# Patient Record
Sex: Female | Born: 1940
Health system: Southern US, Community
[De-identification: ages and names within clinical notes are randomized; demographics above are authoritative.]

## PROBLEM LIST (undated history)

## (undated) DIAGNOSIS — T4145XA Adverse effect of unspecified anesthetic, initial encounter: Secondary | ICD-10-CM

## (undated) DIAGNOSIS — M1990 Unspecified osteoarthritis, unspecified site: Secondary | ICD-10-CM

## (undated) DIAGNOSIS — Z9889 Other specified postprocedural states: Secondary | ICD-10-CM

## (undated) DIAGNOSIS — Z87442 Personal history of urinary calculi: Secondary | ICD-10-CM

## (undated) DIAGNOSIS — T8859XA Other complications of anesthesia, initial encounter: Secondary | ICD-10-CM

## (undated) DIAGNOSIS — R112 Nausea with vomiting, unspecified: Secondary | ICD-10-CM

## (undated) HISTORY — PX: EYE SURGERY: SHX253

## (undated) HISTORY — PX: ABDOMINAL HYSTERECTOMY: SHX81

## (undated) HISTORY — PX: BRAIN SURGERY: SHX531

---

## 1998-10-18 ENCOUNTER — Other Ambulatory Visit: Admission: RE | Admit: 1998-10-18 | Discharge: 1998-10-18 | Payer: Self-pay | Admitting: Family Medicine

## 1999-12-11 ENCOUNTER — Other Ambulatory Visit: Admission: RE | Admit: 1999-12-11 | Discharge: 1999-12-11 | Payer: Self-pay | Admitting: Family Medicine

## 2000-12-12 ENCOUNTER — Other Ambulatory Visit: Admission: RE | Admit: 2000-12-12 | Discharge: 2000-12-12 | Payer: Self-pay | Admitting: Family Medicine

## 2001-12-15 ENCOUNTER — Other Ambulatory Visit: Admission: RE | Admit: 2001-12-15 | Discharge: 2001-12-15 | Payer: Self-pay | Admitting: Family Medicine

## 2002-12-28 ENCOUNTER — Other Ambulatory Visit: Admission: RE | Admit: 2002-12-28 | Discharge: 2002-12-28 | Payer: Self-pay | Admitting: Family Medicine

## 2003-08-12 ENCOUNTER — Ambulatory Visit (HOSPITAL_COMMUNITY): Admission: RE | Admit: 2003-08-12 | Discharge: 2003-08-12 | Payer: Self-pay | Admitting: Neurology

## 2004-01-06 ENCOUNTER — Ambulatory Visit: Payer: Self-pay | Admitting: Family Medicine

## 2004-03-23 ENCOUNTER — Ambulatory Visit: Payer: Self-pay | Admitting: Family Medicine

## 2004-07-17 ENCOUNTER — Ambulatory Visit: Payer: Self-pay | Admitting: Family Medicine

## 2015-05-24 DIAGNOSIS — H26492 Other secondary cataract, left eye: Secondary | ICD-10-CM | POA: Diagnosis not present

## 2015-06-01 DIAGNOSIS — H26492 Other secondary cataract, left eye: Secondary | ICD-10-CM | POA: Diagnosis not present

## 2015-06-21 DIAGNOSIS — E7800 Pure hypercholesterolemia, unspecified: Secondary | ICD-10-CM | POA: Diagnosis not present

## 2015-06-21 DIAGNOSIS — Z0000 Encounter for general adult medical examination without abnormal findings: Secondary | ICD-10-CM | POA: Diagnosis not present

## 2015-06-23 DIAGNOSIS — E7800 Pure hypercholesterolemia, unspecified: Secondary | ICD-10-CM | POA: Diagnosis not present

## 2015-06-23 DIAGNOSIS — Z0000 Encounter for general adult medical examination without abnormal findings: Secondary | ICD-10-CM | POA: Diagnosis not present

## 2015-06-23 DIAGNOSIS — F5101 Primary insomnia: Secondary | ICD-10-CM | POA: Insufficient documentation

## 2015-06-23 DIAGNOSIS — Z1231 Encounter for screening mammogram for malignant neoplasm of breast: Secondary | ICD-10-CM | POA: Diagnosis not present

## 2015-06-23 DIAGNOSIS — M2550 Pain in unspecified joint: Secondary | ICD-10-CM | POA: Diagnosis not present

## 2015-06-23 DIAGNOSIS — G4700 Insomnia, unspecified: Secondary | ICD-10-CM | POA: Diagnosis not present

## 2015-07-20 DIAGNOSIS — L918 Other hypertrophic disorders of the skin: Secondary | ICD-10-CM | POA: Diagnosis not present

## 2015-07-20 DIAGNOSIS — L570 Actinic keratosis: Secondary | ICD-10-CM | POA: Diagnosis not present

## 2015-07-28 DIAGNOSIS — Z1231 Encounter for screening mammogram for malignant neoplasm of breast: Secondary | ICD-10-CM | POA: Diagnosis not present

## 2016-06-14 DIAGNOSIS — H26493 Other secondary cataract, bilateral: Secondary | ICD-10-CM | POA: Diagnosis not present

## 2016-06-27 DIAGNOSIS — I739 Peripheral vascular disease, unspecified: Secondary | ICD-10-CM | POA: Diagnosis not present

## 2016-06-27 DIAGNOSIS — M791 Myalgia, unspecified site: Secondary | ICD-10-CM | POA: Insufficient documentation

## 2016-07-03 DIAGNOSIS — M79662 Pain in left lower leg: Secondary | ICD-10-CM | POA: Diagnosis not present

## 2016-07-03 DIAGNOSIS — M79661 Pain in right lower leg: Secondary | ICD-10-CM | POA: Diagnosis not present

## 2016-07-03 DIAGNOSIS — I739 Peripheral vascular disease, unspecified: Secondary | ICD-10-CM | POA: Diagnosis not present

## 2016-07-09 DIAGNOSIS — Z131 Encounter for screening for diabetes mellitus: Secondary | ICD-10-CM | POA: Diagnosis not present

## 2016-07-09 DIAGNOSIS — E7800 Pure hypercholesterolemia, unspecified: Secondary | ICD-10-CM | POA: Diagnosis not present

## 2016-07-12 DIAGNOSIS — M79605 Pain in left leg: Secondary | ICD-10-CM | POA: Diagnosis not present

## 2016-07-12 DIAGNOSIS — M25572 Pain in left ankle and joints of left foot: Secondary | ICD-10-CM | POA: Diagnosis not present

## 2016-07-12 DIAGNOSIS — M25551 Pain in right hip: Secondary | ICD-10-CM | POA: Diagnosis not present

## 2016-07-12 DIAGNOSIS — M79604 Pain in right leg: Secondary | ICD-10-CM | POA: Diagnosis not present

## 2016-07-12 DIAGNOSIS — M25552 Pain in left hip: Secondary | ICD-10-CM | POA: Diagnosis not present

## 2016-07-12 DIAGNOSIS — E7800 Pure hypercholesterolemia, unspecified: Secondary | ICD-10-CM | POA: Diagnosis not present

## 2016-07-12 DIAGNOSIS — G8929 Other chronic pain: Secondary | ICD-10-CM | POA: Diagnosis not present

## 2016-07-12 DIAGNOSIS — M25571 Pain in right ankle and joints of right foot: Secondary | ICD-10-CM | POA: Diagnosis not present

## 2016-07-12 DIAGNOSIS — M791 Myalgia: Secondary | ICD-10-CM | POA: Diagnosis not present

## 2016-07-12 DIAGNOSIS — M545 Low back pain: Secondary | ICD-10-CM | POA: Diagnosis not present

## 2016-07-18 DIAGNOSIS — M545 Low back pain, unspecified: Secondary | ICD-10-CM | POA: Insufficient documentation

## 2016-07-18 DIAGNOSIS — G8929 Other chronic pain: Secondary | ICD-10-CM | POA: Insufficient documentation

## 2016-07-18 DIAGNOSIS — M25571 Pain in right ankle and joints of right foot: Secondary | ICD-10-CM | POA: Insufficient documentation

## 2016-07-18 DIAGNOSIS — M25551 Pain in right hip: Secondary | ICD-10-CM | POA: Insufficient documentation

## 2016-07-23 DIAGNOSIS — M79604 Pain in right leg: Secondary | ICD-10-CM | POA: Diagnosis not present

## 2016-07-23 DIAGNOSIS — M47816 Spondylosis without myelopathy or radiculopathy, lumbar region: Secondary | ICD-10-CM | POA: Diagnosis not present

## 2016-07-23 DIAGNOSIS — M25571 Pain in right ankle and joints of right foot: Secondary | ICD-10-CM | POA: Diagnosis not present

## 2016-07-23 DIAGNOSIS — M5136 Other intervertebral disc degeneration, lumbar region: Secondary | ICD-10-CM | POA: Diagnosis not present

## 2016-07-23 DIAGNOSIS — G8929 Other chronic pain: Secondary | ICD-10-CM | POA: Diagnosis not present

## 2016-07-23 DIAGNOSIS — M1611 Unilateral primary osteoarthritis, right hip: Secondary | ICD-10-CM | POA: Diagnosis not present

## 2016-07-23 DIAGNOSIS — M48061 Spinal stenosis, lumbar region without neurogenic claudication: Secondary | ICD-10-CM | POA: Diagnosis not present

## 2016-07-23 DIAGNOSIS — M25572 Pain in left ankle and joints of left foot: Secondary | ICD-10-CM | POA: Diagnosis not present

## 2016-07-23 DIAGNOSIS — M160 Bilateral primary osteoarthritis of hip: Secondary | ICD-10-CM | POA: Diagnosis not present

## 2016-07-23 DIAGNOSIS — M545 Low back pain: Secondary | ICD-10-CM | POA: Diagnosis not present

## 2016-07-23 DIAGNOSIS — M25552 Pain in left hip: Secondary | ICD-10-CM | POA: Diagnosis not present

## 2016-07-23 DIAGNOSIS — M25579 Pain in unspecified ankle and joints of unspecified foot: Secondary | ICD-10-CM | POA: Diagnosis not present

## 2016-07-23 DIAGNOSIS — M79605 Pain in left leg: Secondary | ICD-10-CM | POA: Diagnosis not present

## 2016-08-02 DIAGNOSIS — N2889 Other specified disorders of kidney and ureter: Secondary | ICD-10-CM | POA: Insufficient documentation

## 2016-08-10 DIAGNOSIS — N200 Calculus of kidney: Secondary | ICD-10-CM | POA: Diagnosis not present

## 2016-08-10 DIAGNOSIS — N2889 Other specified disorders of kidney and ureter: Secondary | ICD-10-CM | POA: Diagnosis not present

## 2016-08-14 DIAGNOSIS — S30861A Insect bite (nonvenomous) of abdominal wall, initial encounter: Secondary | ICD-10-CM | POA: Diagnosis not present

## 2016-08-14 DIAGNOSIS — W57XXXA Bitten or stung by nonvenomous insect and other nonvenomous arthropods, initial encounter: Secondary | ICD-10-CM | POA: Insufficient documentation

## 2016-08-14 DIAGNOSIS — S30860A Insect bite (nonvenomous) of lower back and pelvis, initial encounter: Secondary | ICD-10-CM | POA: Diagnosis not present

## 2016-08-23 DIAGNOSIS — N200 Calculus of kidney: Secondary | ICD-10-CM | POA: Diagnosis not present

## 2016-08-28 DIAGNOSIS — N200 Calculus of kidney: Secondary | ICD-10-CM | POA: Diagnosis not present

## 2016-09-06 DIAGNOSIS — N200 Calculus of kidney: Secondary | ICD-10-CM | POA: Diagnosis not present

## 2016-09-26 DIAGNOSIS — R030 Elevated blood-pressure reading, without diagnosis of hypertension: Secondary | ICD-10-CM | POA: Diagnosis not present

## 2016-09-26 DIAGNOSIS — Z6827 Body mass index (BMI) 27.0-27.9, adult: Secondary | ICD-10-CM | POA: Diagnosis not present

## 2016-09-26 DIAGNOSIS — M48062 Spinal stenosis, lumbar region with neurogenic claudication: Secondary | ICD-10-CM | POA: Diagnosis not present

## 2016-09-28 ENCOUNTER — Other Ambulatory Visit: Payer: Self-pay | Admitting: Neurological Surgery

## 2016-09-28 DIAGNOSIS — M48062 Spinal stenosis, lumbar region with neurogenic claudication: Secondary | ICD-10-CM

## 2016-10-02 ENCOUNTER — Ambulatory Visit
Admission: RE | Admit: 2016-10-02 | Discharge: 2016-10-02 | Disposition: A | Discharge status: Home / Self Care | Payer: Medicare HMO | Source: Ambulatory Visit | Attending: Neurological Surgery | Admitting: Neurological Surgery

## 2016-10-02 DIAGNOSIS — M48062 Spinal stenosis, lumbar region with neurogenic claudication: Secondary | ICD-10-CM

## 2016-10-02 DIAGNOSIS — M5127 Other intervertebral disc displacement, lumbosacral region: Secondary | ICD-10-CM | POA: Diagnosis not present

## 2016-10-17 DIAGNOSIS — N200 Calculus of kidney: Secondary | ICD-10-CM | POA: Insufficient documentation

## 2016-10-17 DIAGNOSIS — M5441 Lumbago with sciatica, right side: Secondary | ICD-10-CM | POA: Diagnosis not present

## 2016-10-17 DIAGNOSIS — M549 Dorsalgia, unspecified: Secondary | ICD-10-CM | POA: Diagnosis not present

## 2016-10-17 DIAGNOSIS — G8929 Other chronic pain: Secondary | ICD-10-CM | POA: Diagnosis not present

## 2016-10-23 DIAGNOSIS — E782 Mixed hyperlipidemia: Secondary | ICD-10-CM | POA: Diagnosis not present

## 2016-10-23 DIAGNOSIS — M545 Low back pain: Secondary | ICD-10-CM | POA: Diagnosis not present

## 2016-10-23 DIAGNOSIS — Z90710 Acquired absence of both cervix and uterus: Secondary | ICD-10-CM | POA: Diagnosis not present

## 2016-10-23 DIAGNOSIS — N200 Calculus of kidney: Secondary | ICD-10-CM | POA: Diagnosis not present

## 2016-10-23 DIAGNOSIS — M48062 Spinal stenosis, lumbar region with neurogenic claudication: Secondary | ICD-10-CM | POA: Diagnosis not present

## 2016-10-24 DIAGNOSIS — M48062 Spinal stenosis, lumbar region with neurogenic claudication: Secondary | ICD-10-CM | POA: Diagnosis not present

## 2016-10-26 DIAGNOSIS — M5116 Intervertebral disc disorders with radiculopathy, lumbar region: Secondary | ICD-10-CM | POA: Diagnosis not present

## 2016-10-26 DIAGNOSIS — M48061 Spinal stenosis, lumbar region without neurogenic claudication: Secondary | ICD-10-CM | POA: Diagnosis not present

## 2016-10-26 DIAGNOSIS — M4726 Other spondylosis with radiculopathy, lumbar region: Secondary | ICD-10-CM | POA: Diagnosis not present

## 2016-11-02 DIAGNOSIS — E782 Mixed hyperlipidemia: Secondary | ICD-10-CM | POA: Diagnosis not present

## 2016-11-26 DIAGNOSIS — G8929 Other chronic pain: Secondary | ICD-10-CM | POA: Diagnosis not present

## 2016-11-26 DIAGNOSIS — N200 Calculus of kidney: Secondary | ICD-10-CM | POA: Diagnosis not present

## 2016-11-26 DIAGNOSIS — M549 Dorsalgia, unspecified: Secondary | ICD-10-CM | POA: Diagnosis not present

## 2016-12-11 DIAGNOSIS — Z87891 Personal history of nicotine dependence: Secondary | ICD-10-CM | POA: Diagnosis not present

## 2016-12-11 DIAGNOSIS — G4700 Insomnia, unspecified: Secondary | ICD-10-CM | POA: Diagnosis not present

## 2016-12-11 DIAGNOSIS — R030 Elevated blood-pressure reading, without diagnosis of hypertension: Secondary | ICD-10-CM | POA: Diagnosis not present

## 2016-12-11 DIAGNOSIS — N200 Calculus of kidney: Secondary | ICD-10-CM | POA: Diagnosis not present

## 2016-12-11 DIAGNOSIS — Z9889 Other specified postprocedural states: Secondary | ICD-10-CM | POA: Diagnosis not present

## 2016-12-11 DIAGNOSIS — M4316 Spondylolisthesis, lumbar region: Secondary | ICD-10-CM | POA: Diagnosis not present

## 2016-12-11 DIAGNOSIS — N2889 Other specified disorders of kidney and ureter: Secondary | ICD-10-CM | POA: Diagnosis not present

## 2016-12-11 DIAGNOSIS — J302 Other seasonal allergic rhinitis: Secondary | ICD-10-CM | POA: Diagnosis not present

## 2016-12-12 DIAGNOSIS — N200 Calculus of kidney: Secondary | ICD-10-CM | POA: Diagnosis not present

## 2016-12-24 DIAGNOSIS — Z466 Encounter for fitting and adjustment of urinary device: Secondary | ICD-10-CM | POA: Diagnosis not present

## 2016-12-24 DIAGNOSIS — Z87442 Personal history of urinary calculi: Secondary | ICD-10-CM | POA: Diagnosis not present

## 2016-12-24 DIAGNOSIS — N200 Calculus of kidney: Secondary | ICD-10-CM | POA: Diagnosis not present

## 2017-03-08 DIAGNOSIS — Z1231 Encounter for screening mammogram for malignant neoplasm of breast: Secondary | ICD-10-CM | POA: Diagnosis not present

## 2017-03-15 DIAGNOSIS — M5136 Other intervertebral disc degeneration, lumbar region: Secondary | ICD-10-CM | POA: Diagnosis not present

## 2017-03-15 DIAGNOSIS — M48061 Spinal stenosis, lumbar region without neurogenic claudication: Secondary | ICD-10-CM | POA: Diagnosis not present

## 2017-03-15 DIAGNOSIS — M4726 Other spondylosis with radiculopathy, lumbar region: Secondary | ICD-10-CM | POA: Diagnosis not present

## 2017-03-15 DIAGNOSIS — M5416 Radiculopathy, lumbar region: Secondary | ICD-10-CM | POA: Diagnosis not present

## 2017-03-27 DIAGNOSIS — Z87891 Personal history of nicotine dependence: Secondary | ICD-10-CM | POA: Diagnosis not present

## 2017-03-27 DIAGNOSIS — N200 Calculus of kidney: Secondary | ICD-10-CM | POA: Diagnosis not present

## 2017-03-27 DIAGNOSIS — G8929 Other chronic pain: Secondary | ICD-10-CM | POA: Diagnosis not present

## 2017-03-27 DIAGNOSIS — M549 Dorsalgia, unspecified: Secondary | ICD-10-CM | POA: Diagnosis not present

## 2017-03-27 DIAGNOSIS — N2889 Other specified disorders of kidney and ureter: Secondary | ICD-10-CM | POA: Diagnosis not present

## 2017-03-27 DIAGNOSIS — Z09 Encounter for follow-up examination after completed treatment for conditions other than malignant neoplasm: Secondary | ICD-10-CM | POA: Diagnosis not present

## 2017-06-11 DIAGNOSIS — D1801 Hemangioma of skin and subcutaneous tissue: Secondary | ICD-10-CM | POA: Diagnosis not present

## 2017-06-11 DIAGNOSIS — D225 Melanocytic nevi of trunk: Secondary | ICD-10-CM | POA: Diagnosis not present

## 2017-06-11 DIAGNOSIS — L570 Actinic keratosis: Secondary | ICD-10-CM | POA: Diagnosis not present

## 2017-06-11 DIAGNOSIS — L821 Other seborrheic keratosis: Secondary | ICD-10-CM | POA: Diagnosis not present

## 2017-06-11 DIAGNOSIS — D485 Neoplasm of uncertain behavior of skin: Secondary | ICD-10-CM | POA: Diagnosis not present

## 2017-06-11 DIAGNOSIS — D2239 Melanocytic nevi of other parts of face: Secondary | ICD-10-CM | POA: Diagnosis not present

## 2017-06-17 DIAGNOSIS — H26493 Other secondary cataract, bilateral: Secondary | ICD-10-CM | POA: Diagnosis not present

## 2017-06-20 DIAGNOSIS — S82832A Other fracture of upper and lower end of left fibula, initial encounter for closed fracture: Secondary | ICD-10-CM | POA: Diagnosis not present

## 2017-07-02 DIAGNOSIS — M5416 Radiculopathy, lumbar region: Secondary | ICD-10-CM | POA: Diagnosis not present

## 2017-07-02 DIAGNOSIS — M4726 Other spondylosis with radiculopathy, lumbar region: Secondary | ICD-10-CM | POA: Diagnosis not present

## 2017-07-04 DIAGNOSIS — S82832A Other fracture of upper and lower end of left fibula, initial encounter for closed fracture: Secondary | ICD-10-CM | POA: Diagnosis not present

## 2017-07-18 DIAGNOSIS — S82839D Other fracture of upper and lower end of unspecified fibula, subsequent encounter for closed fracture with routine healing: Secondary | ICD-10-CM | POA: Diagnosis not present

## 2017-07-23 DIAGNOSIS — M79605 Pain in left leg: Secondary | ICD-10-CM | POA: Diagnosis not present

## 2017-07-23 DIAGNOSIS — I82432 Acute embolism and thrombosis of left popliteal vein: Secondary | ICD-10-CM | POA: Diagnosis not present

## 2017-07-23 DIAGNOSIS — I82412 Acute embolism and thrombosis of left femoral vein: Secondary | ICD-10-CM | POA: Diagnosis not present

## 2017-07-23 DIAGNOSIS — S82435A Nondisplaced oblique fracture of shaft of left fibula, initial encounter for closed fracture: Secondary | ICD-10-CM | POA: Diagnosis not present

## 2017-07-23 DIAGNOSIS — R600 Localized edema: Secondary | ICD-10-CM | POA: Diagnosis not present

## 2017-07-30 DIAGNOSIS — R5383 Other fatigue: Secondary | ICD-10-CM | POA: Diagnosis not present

## 2017-07-30 DIAGNOSIS — S82435A Nondisplaced oblique fracture of shaft of left fibula, initial encounter for closed fracture: Secondary | ICD-10-CM | POA: Diagnosis not present

## 2017-07-30 DIAGNOSIS — I82402 Acute embolism and thrombosis of unspecified deep veins of left lower extremity: Secondary | ICD-10-CM | POA: Diagnosis not present

## 2017-07-30 DIAGNOSIS — G44219 Episodic tension-type headache, not intractable: Secondary | ICD-10-CM | POA: Diagnosis not present

## 2017-08-01 DIAGNOSIS — S82839D Other fracture of upper and lower end of unspecified fibula, subsequent encounter for closed fracture with routine healing: Secondary | ICD-10-CM | POA: Diagnosis not present

## 2017-08-21 DIAGNOSIS — R5381 Other malaise: Secondary | ICD-10-CM | POA: Diagnosis not present

## 2017-08-21 DIAGNOSIS — M545 Low back pain: Secondary | ICD-10-CM | POA: Diagnosis not present

## 2017-08-21 DIAGNOSIS — R3129 Other microscopic hematuria: Secondary | ICD-10-CM | POA: Diagnosis not present

## 2017-08-21 DIAGNOSIS — R319 Hematuria, unspecified: Secondary | ICD-10-CM | POA: Diagnosis not present

## 2017-08-21 DIAGNOSIS — R197 Diarrhea, unspecified: Secondary | ICD-10-CM | POA: Diagnosis not present

## 2017-08-23 DIAGNOSIS — N3001 Acute cystitis with hematuria: Secondary | ICD-10-CM | POA: Diagnosis not present

## 2017-08-30 DIAGNOSIS — S82839D Other fracture of upper and lower end of unspecified fibula, subsequent encounter for closed fracture with routine healing: Secondary | ICD-10-CM | POA: Diagnosis not present

## 2017-09-16 DIAGNOSIS — N959 Unspecified menopausal and perimenopausal disorder: Secondary | ICD-10-CM | POA: Diagnosis not present

## 2017-09-16 DIAGNOSIS — M85852 Other specified disorders of bone density and structure, left thigh: Secondary | ICD-10-CM | POA: Diagnosis not present

## 2017-09-16 DIAGNOSIS — M8580 Other specified disorders of bone density and structure, unspecified site: Secondary | ICD-10-CM | POA: Diagnosis not present

## 2017-09-17 DIAGNOSIS — I82402 Acute embolism and thrombosis of unspecified deep veins of left lower extremity: Secondary | ICD-10-CM | POA: Diagnosis not present

## 2017-09-17 DIAGNOSIS — M8589 Other specified disorders of bone density and structure, multiple sites: Secondary | ICD-10-CM | POA: Diagnosis not present

## 2017-09-19 DIAGNOSIS — I82402 Acute embolism and thrombosis of unspecified deep veins of left lower extremity: Secondary | ICD-10-CM | POA: Diagnosis not present

## 2017-09-19 DIAGNOSIS — I82412 Acute embolism and thrombosis of left femoral vein: Secondary | ICD-10-CM | POA: Diagnosis not present

## 2017-09-25 DIAGNOSIS — R030 Elevated blood-pressure reading, without diagnosis of hypertension: Secondary | ICD-10-CM | POA: Diagnosis not present

## 2017-09-25 DIAGNOSIS — M4316 Spondylolisthesis, lumbar region: Secondary | ICD-10-CM | POA: Diagnosis not present

## 2017-09-25 DIAGNOSIS — Z6825 Body mass index (BMI) 25.0-25.9, adult: Secondary | ICD-10-CM | POA: Diagnosis not present

## 2017-10-10 DIAGNOSIS — Z0001 Encounter for general adult medical examination with abnormal findings: Secondary | ICD-10-CM | POA: Diagnosis not present

## 2017-10-10 DIAGNOSIS — E782 Mixed hyperlipidemia: Secondary | ICD-10-CM | POA: Diagnosis not present

## 2017-10-10 DIAGNOSIS — Z6825 Body mass index (BMI) 25.0-25.9, adult: Secondary | ICD-10-CM | POA: Diagnosis not present

## 2017-10-11 DIAGNOSIS — S82839D Other fracture of upper and lower end of unspecified fibula, subsequent encounter for closed fracture with routine healing: Secondary | ICD-10-CM | POA: Diagnosis not present

## 2017-10-23 DIAGNOSIS — I82402 Acute embolism and thrombosis of unspecified deep veins of left lower extremity: Secondary | ICD-10-CM | POA: Diagnosis not present

## 2017-10-23 DIAGNOSIS — H6121 Impacted cerumen, right ear: Secondary | ICD-10-CM | POA: Diagnosis not present

## 2017-10-23 DIAGNOSIS — Z23 Encounter for immunization: Secondary | ICD-10-CM | POA: Diagnosis not present

## 2017-11-01 ENCOUNTER — Other Ambulatory Visit: Payer: Self-pay | Admitting: Neurological Surgery

## 2017-11-01 DIAGNOSIS — M4316 Spondylolisthesis, lumbar region: Secondary | ICD-10-CM

## 2017-11-20 DIAGNOSIS — R600 Localized edema: Secondary | ICD-10-CM | POA: Diagnosis not present

## 2017-11-20 DIAGNOSIS — I82402 Acute embolism and thrombosis of unspecified deep veins of left lower extremity: Secondary | ICD-10-CM | POA: Diagnosis not present

## 2017-12-16 DIAGNOSIS — N200 Calculus of kidney: Secondary | ICD-10-CM | POA: Diagnosis not present

## 2018-01-10 DIAGNOSIS — S82839D Other fracture of upper and lower end of unspecified fibula, subsequent encounter for closed fracture with routine healing: Secondary | ICD-10-CM | POA: Diagnosis not present

## 2018-01-13 ENCOUNTER — Ambulatory Visit
Admission: RE | Admit: 2018-01-13 | Discharge: 2018-01-13 | Disposition: A | Discharge status: Home / Self Care | Payer: Medicare HMO | Source: Ambulatory Visit | Attending: Neurological Surgery | Admitting: Neurological Surgery

## 2018-01-13 DIAGNOSIS — M4316 Spondylolisthesis, lumbar region: Secondary | ICD-10-CM

## 2018-01-13 DIAGNOSIS — M48061 Spinal stenosis, lumbar region without neurogenic claudication: Secondary | ICD-10-CM | POA: Diagnosis not present

## 2018-02-19 DIAGNOSIS — M48062 Spinal stenosis, lumbar region with neurogenic claudication: Secondary | ICD-10-CM | POA: Diagnosis not present

## 2018-02-27 ENCOUNTER — Other Ambulatory Visit: Payer: Self-pay | Admitting: Neurological Surgery

## 2018-03-20 NOTE — Pre-Procedure Instructions (Signed)
WRENLEY SAYED  03/20/2018      [facility] [address], [place] [address] Phone: [phone] Fax: [phone]    Your procedure is scheduled on Mon. Feb. 24, 2020 from 9:47AM-2:05PM  Report to [facility] Entrance "A" at 7:45AM  Call this number if you have problems the morning of surgery:  [phone]   Remember:  Do not eat or drink after midnight on Feb. 23rd    Take these medicines the morning of surgery with A SIP OF WATER: Fexofenadine (ALLEGRA) and TraMADol (ULTRAM  If needed: Acetaminophen (TYLENOL) and Hydroxypropyl methylcellulose / hypromellose (ISOPTO TEARS / GONIOVISC)    7 days before surgery (03/24/18), stop taking all Other Aspirin Products, Vitamins, Fish oils, and Herbal medications. Also stop all NSAIDS i.e. Advil, Ibuprofen, Motrin, Aleve, Anaprox, Naproxen, BC, Goody Powders, and all Supplements.    Do not wear jewelry, make-up or nail polish.  Do not wear lotions, powders, or perfumes, or deodorant.  Do not shave 48 hours prior to surgery.    Do not bring valuables to the hospital.  [facility] is not responsible for any belongings or valuables.  Contacts, dentures or bridgework may not be worn into surgery.  Leave your suitcase in the car.  After surgery it may be brought to your room.  For patients admitted to the hospital, discharge time will be determined by your treatment team.  Patients discharged the day of surgery will not be allowed to drive home.   Special instructions:  [facility]- Preparing For Surgery  Before surgery, you can play an important role. Because skin is not sterile, your skin needs to be as free of germs as possible. You can reduce the number of germs on your skin by washing with CHG (chlorahexidine gluconate) Soap before surgery.  CHG is an antiseptic cleaner which kills germs and bonds with the skin to continue killing germs even after washing.     Oral Hygiene is also important to reduce your risk of infection.  Remember - BRUSH YOUR TEETH THE MORNING OF SURGERY WITH YOUR REGULAR TOOTHPASTE  Please do not use if you have an allergy to CHG or antibacterial soaps. If your skin becomes reddened/irritated stop using the CHG.  Do not shave (including legs and underarms) for at least 48 hours prior to first CHG shower. It is OK to shave your face.  Please follow these instructions carefully.   1. Shower the NIGHT BEFORE SURGERY and the MORNING OF SURGERY with CHG.   2. If you chose to wash your hair, wash your hair first as usual with your normal shampoo.  3. After you shampoo, rinse your hair and body thoroughly to remove the shampoo.  4. Use CHG as you would any other liquid soap. You can apply CHG directly to the skin and wash gently with a scrungie or a clean washcloth.   5. Apply the CHG Soap to your body ONLY FROM THE NECK DOWN.  Do not use on open wounds or open sores. Avoid contact with your eyes, ears, mouth and genitals (private parts). Wash Face and genitals (private parts)  with your normal soap.  6. Wash thoroughly, paying special attention to the area where your surgery will be performed.  7. Thoroughly rinse your body with warm water from the neck down.  8. DO NOT shower/wash with your normal soap after using and rinsing off the CHG Soap.  9. [place]  yourself dry with a CLEAN TOWEL.  10. Wear CLEAN PAJAMAS to bed the night before surgery, wear comfortable clothes the morning of surgery  11. Place CLEAN SHEETS on your bed the night of your first shower and DO NOT SLEEP WITH PETS.  Day of Surgery:  Do not apply any deodorants/lotions.  Please wear clean clothes to the hospital/surgery center.   Remember to brush your teeth WITH YOUR REGULAR TOOTHPASTE.  Please read over the following fact sheets that you were given. Pain Booklet, Coughing and Deep Breathing, MRSA Information and Surgical Site Infection  Prevention

## 2018-03-21 ENCOUNTER — Other Ambulatory Visit: Payer: Self-pay

## 2018-03-21 ENCOUNTER — Encounter (HOSPITAL_COMMUNITY): Payer: Self-pay | Admitting: *Deleted

## 2018-03-21 ENCOUNTER — Encounter (HOSPITAL_COMMUNITY)
Admission: RE | Admit: 2018-03-21 | Discharge: 2018-03-21 | Disposition: A | Discharge status: Home / Self Care | Payer: Medicare HMO | Source: Ambulatory Visit | Attending: Neurological Surgery | Admitting: Neurological Surgery

## 2018-03-21 DIAGNOSIS — Z01812 Encounter for preprocedural laboratory examination: Secondary | ICD-10-CM | POA: Insufficient documentation

## 2018-03-21 HISTORY — DX: Other specified postprocedural states: Z98.890

## 2018-03-21 HISTORY — DX: Nausea with vomiting, unspecified: R11.2

## 2018-03-21 HISTORY — DX: Other complications of anesthesia, initial encounter: T88.59XA

## 2018-03-21 HISTORY — DX: Unspecified osteoarthritis, unspecified site: M19.90

## 2018-03-21 HISTORY — DX: Adverse effect of unspecified anesthetic, initial encounter: T41.45XA

## 2018-03-21 HISTORY — DX: Personal history of urinary calculi: Z87.442

## 2018-03-21 LAB — CBC
HEMATOCRIT: 43.5 % (ref 36.0–46.0)
HEMOGLOBIN: 13.9 g/dL (ref 12.0–15.0)
MCH: 29.3 pg (ref 26.0–34.0)
MCHC: 32 g/dL (ref 30.0–36.0)
MCV: 91.6 fL (ref 80.0–100.0)
NRBC: 0 % (ref 0.0–0.2)
PLATELETS: 188 10*3/uL (ref 150–400)
RBC: 4.75 MIL/uL (ref 3.87–5.11)
RDW: 13 % (ref 11.5–15.5)
WBC: 7.1 10*3/uL (ref 4.0–10.5)

## 2018-03-21 LAB — BASIC METABOLIC PANEL
ANION GAP: 9 (ref 5–15)
BUN: 17 mg/dL (ref 8–23)
CHLORIDE: 107 mmol/L (ref 98–111)
CO2: 24 mmol/L (ref 22–32)
Calcium: 9.2 mg/dL (ref 8.9–10.3)
Creatinine, Ser: 0.78 mg/dL (ref 0.44–1.00)
GFR calc Af Amer: >60 mL/min (ref >60)
GFR calc non Af Amer: >60 mL/min (ref >60)
GLUCOSE: 84 mg/dL (ref 70–99)
POTASSIUM: 4.2 mmol/L (ref 3.5–5.1)
Sodium: 140 mmol/L (ref 135–145)

## 2018-03-21 LAB — TYPE AND SCREEN
ABO/RH(D): A NEG
Antibody Screen: NEGATIVE

## 2018-03-21 LAB — SURGICAL PCR SCREEN
MRSA, PCR: NEGATIVE
Staphylococcus aureus: NEGATIVE

## 2018-03-21 LAB — ABO/RH: ABO/RH(D): A NEG

## 2018-03-21 NOTE — Progress Notes (Signed)
PCP -  Dr. Rochel Brome  LOV  07/2017 H/O kidney stone H/O DVT back in 2019 Had been on eliquis, but that was stopped in Oct. 2019 Cardiologist - n/a  Chest x-ray -  n/a EKG - n/a Stress Test - n/a ECHO - n/a Cardiac Cath - n/a  Sleep Study - n/a CPAP - n/a  Fasting Blood Sugar - n/a Checks Blood Sugar _____ times a day  Blood Thinner Instructions: Aspirin Instructions:  Anesthesia review: no  Patient denies shortness of breath, fever, cough and chest pain at PAT appointment   Patient verbalized understanding of instructions that were given to them at the PAT appointment. Patient was also instructed that they will need to review over the PAT instructions again at home before surgery.

## 2018-03-21 NOTE — Pre-Procedure Instructions (Signed)
Claudia Dickerson  03/21/2018      [facility] [address] [place], [place] [address] [place] [address] Phone: [phone] Fax: [phone]    Your procedure is scheduled on Mon. Feb. 24, 2020    Report to [facility] Entrance "A" at 7:45AM             (from 9:47AM-2:05PM)   Call this number if you have problems the morning of surgery:  [phone]   Remember:  Do not eat any foods or drink any liquids after midnight on Sunday Feb. 23rd    Take these medicines the morning of surgery with A SIP OF WATER: Fexofenadine (ALLEGRA) and TraMADol (ULTRAM  If needed: Acetaminophen (TYLENOL) and Hydroxypropyl methylcellulose / hypromellose (ISOPTO TEARS / GONIOVISC)    7 days before surgery (03/24/18), stop taking all Other Aspirin Products, Vitamins, Fish oils, and Herbal medications. Also stop all NSAIDS i.e. Advil, Ibuprofen, Motrin, Aleve, Anaprox, Naproxen, BC, Goody Powders, and all Supplements.    Do not wear jewelry, make-up or nail polish.  Do not wear lotions, powders, or perfumes, or deodorant.  Do not shave 48 hours prior to surgery.    Do not bring valuables to the hospital.  [facility] is not responsible for any belongings or valuables.  Contacts, dentures or bridgework may not be worn into surgery.  Leave your suitcase in the car.  After surgery it may be brought to your room.  For patients admitted to the hospital, discharge time will be determined by your treatment team.     [facility]- Preparing For Surgery  Before surgery, you can play an important role. Because skin is not sterile, your skin needs to be as free of germs as possible. You can reduce the number of germs on your skin by washing with CHG (chlorahexidine gluconate) Soap before surgery.  CHG is an antiseptic cleaner which kills germs and bonds with the skin to continue killing germs even after washing.    Oral Hygiene is also important to reduce your risk  of infection.    Remember - BRUSH YOUR TEETH THE MORNING OF SURGERY WITH YOUR REGULAR TOOTHPASTE  Please do not use if you have an allergy to CHG or antibacterial soaps. If your skin becomes reddened/irritated stop using the CHG.  Do not shave (including legs and underarms) for at least 48 hours prior to first CHG shower. It is OK to shave your face.  Please follow these instructions carefully.   1. Shower the NIGHT BEFORE SURGERY and the MORNING OF SURGERY with CHG.   2. If you chose to wash your hair, wash your hair first as usual with your normal shampoo.  3. After you shampoo, rinse your hair and body thoroughly to remove the shampoo.  4. Use CHG as you would any other liquid soap. You can apply CHG directly to the skin and wash gently with a scrungie or a clean washcloth.   5. Apply the CHG Soap to your body ONLY FROM THE NECK DOWN.  Do not use on open wounds or open sores. Avoid contact with your eyes, ears, mouth and genitals (private parts). Wash Face and genitals (private parts)  with your normal soap.  6. Wash thoroughly, paying special attention to the area where your surgery will be performed.  7. Thoroughly rinse your body with warm water from the neck down.  8. DO NOT shower/wash with your normal soap after using and rinsing  off the CHG Soap.  9. Pat yourself dry with a CLEAN TOWEL.  10. Wear CLEAN PAJAMAS to bed the night before surgery, wear comfortable clothes the morning of surgery  11. Place CLEAN SHEETS on your bed the night of your first shower and DO NOT SLEEP WITH PETS.  Day of Surgery:  Do not apply any deodorants/lotions.  Please wear clean clothes to the hospital/surgery center.   Remember to brush your teeth WITH YOUR REGULAR TOOTHPASTE.  Please read over the following fact sheets that you were given. Pain Booklet, MRSA Information and Surgical Site Infection Prevention

## 2018-03-31 ENCOUNTER — Encounter (HOSPITAL_COMMUNITY): Payer: Self-pay

## 2018-03-31 ENCOUNTER — Inpatient Hospital Stay (HOSPITAL_COMMUNITY): Payer: Medicare HMO | Admitting: Anesthesiology

## 2018-03-31 ENCOUNTER — Other Ambulatory Visit: Payer: Self-pay

## 2018-03-31 ENCOUNTER — Encounter (HOSPITAL_COMMUNITY)
Admission: RE | Disposition: A | Discharge status: Home / Self Care | Payer: Self-pay | Source: Home / Self Care | Attending: Neurological Surgery

## 2018-03-31 ENCOUNTER — Inpatient Hospital Stay (HOSPITAL_COMMUNITY): Payer: Medicare HMO

## 2018-03-31 ENCOUNTER — Inpatient Hospital Stay (HOSPITAL_COMMUNITY)
Admission: RE | Admit: 2018-03-31 | Discharge: 2018-04-03 | DRG: 455 | Disposition: A | Discharge status: Home / Self Care | Payer: Medicare HMO | Attending: Neurological Surgery | Admitting: Neurological Surgery

## 2018-03-31 DIAGNOSIS — Z79899 Other long term (current) drug therapy: Secondary | ICD-10-CM | POA: Diagnosis not present

## 2018-03-31 DIAGNOSIS — K5900 Constipation, unspecified: Secondary | ICD-10-CM | POA: Diagnosis not present

## 2018-03-31 DIAGNOSIS — Z880 Allergy status to penicillin: Secondary | ICD-10-CM | POA: Diagnosis not present

## 2018-03-31 DIAGNOSIS — Z87442 Personal history of urinary calculi: Secondary | ICD-10-CM | POA: Diagnosis not present

## 2018-03-31 DIAGNOSIS — M4726 Other spondylosis with radiculopathy, lumbar region: Secondary | ICD-10-CM | POA: Diagnosis not present

## 2018-03-31 DIAGNOSIS — M5416 Radiculopathy, lumbar region: Secondary | ICD-10-CM | POA: Diagnosis not present

## 2018-03-31 DIAGNOSIS — M1990 Unspecified osteoarthritis, unspecified site: Secondary | ICD-10-CM | POA: Diagnosis present

## 2018-03-31 DIAGNOSIS — Z79891 Long term (current) use of opiate analgesic: Secondary | ICD-10-CM

## 2018-03-31 DIAGNOSIS — Z87891 Personal history of nicotine dependence: Secondary | ICD-10-CM

## 2018-03-31 DIAGNOSIS — Z86718 Personal history of other venous thrombosis and embolism: Secondary | ICD-10-CM | POA: Diagnosis not present

## 2018-03-31 DIAGNOSIS — Z90710 Acquired absence of both cervix and uterus: Secondary | ICD-10-CM

## 2018-03-31 DIAGNOSIS — Z9841 Cataract extraction status, right eye: Secondary | ICD-10-CM

## 2018-03-31 DIAGNOSIS — M4316 Spondylolisthesis, lumbar region: Secondary | ICD-10-CM | POA: Diagnosis not present

## 2018-03-31 DIAGNOSIS — M5136 Other intervertebral disc degeneration, lumbar region: Secondary | ICD-10-CM | POA: Diagnosis not present

## 2018-03-31 DIAGNOSIS — M4326 Fusion of spine, lumbar region: Secondary | ICD-10-CM | POA: Diagnosis not present

## 2018-03-31 DIAGNOSIS — Z9842 Cataract extraction status, left eye: Secondary | ICD-10-CM | POA: Diagnosis not present

## 2018-03-31 DIAGNOSIS — Z419 Encounter for procedure for purposes other than remedying health state, unspecified: Secondary | ICD-10-CM

## 2018-03-31 DIAGNOSIS — M48062 Spinal stenosis, lumbar region with neurogenic claudication: Secondary | ICD-10-CM | POA: Diagnosis present

## 2018-03-31 DIAGNOSIS — M48061 Spinal stenosis, lumbar region without neurogenic claudication: Secondary | ICD-10-CM | POA: Diagnosis not present

## 2018-03-31 HISTORY — PX: LAMINECTOMY: SHX219

## 2018-03-31 SURGERY — POSTERIOR LUMBAR FUSION 2 LEVEL
Anesthesia: General | Site: Back

## 2018-03-31 MED ORDER — POLYETHYLENE GLYCOL 3350 17 G PO PACK
17.0000 g | PACK | Freq: Every day | ORAL | Status: DC | PRN
  Administered 2018-04-01: 17 g via ORAL
  Filled 2018-03-31 (×2): qty 1

## 2018-03-31 MED ORDER — DIAZEPAM 5 MG PO TABS
5.0000 mg | ORAL_TABLET | Freq: Every evening | ORAL | Status: DC | PRN
  Administered 2018-03-31 – 2018-04-01 (×2): 5 mg via ORAL
  Filled 2018-03-31 (×2): qty 1

## 2018-03-31 MED ORDER — THROMBIN 20000 UNITS EX SOLR
CUTANEOUS | Status: AC
  Filled 2018-03-31: qty 20000

## 2018-03-31 MED ORDER — THROMBIN 20000 UNITS EX SOLR
CUTANEOUS | Status: DC | PRN
  Administered 2018-03-31: 10:00:00 via TOPICAL

## 2018-03-31 MED ORDER — KETOROLAC TROMETHAMINE 15 MG/ML IJ SOLN
7.5000 mg | Freq: Four times a day (QID) | INTRAMUSCULAR | Status: AC
  Administered 2018-03-31 – 2018-04-01 (×4): 7.5 mg via INTRAVENOUS
  Filled 2018-03-31 (×4): qty 1

## 2018-03-31 MED ORDER — ACETAMINOPHEN 650 MG RE SUPP: 650.0000 mg | RECTAL | Status: DC | PRN

## 2018-03-31 MED ORDER — METHOCARBAMOL 1000 MG/10ML IJ SOLN
500.0000 mg | Freq: Four times a day (QID) | INTRAVENOUS | Status: DC | PRN
  Filled 2018-03-31: qty 5

## 2018-03-31 MED ORDER — TRAMADOL HCL 50 MG PO TABS
50.0000 mg | ORAL_TABLET | Freq: Two times a day (BID) | ORAL | Status: DC
  Administered 2018-03-31 – 2018-04-02 (×4): 50 mg via ORAL
  Filled 2018-03-31 (×6): qty 1

## 2018-03-31 MED ORDER — BUPIVACAINE HCL (PF) 0.5 % IJ SOLN
INTRAMUSCULAR | Status: AC
  Filled 2018-03-31: qty 30

## 2018-03-31 MED ORDER — SODIUM CHLORIDE 0.9% FLUSH: 3.0000 mL | INTRAVENOUS | Status: DC | PRN

## 2018-03-31 MED ORDER — SACCHAROMYCES BOULARDII 250 MG PO CAPS
250.0000 mg | ORAL_CAPSULE | Freq: Every day | ORAL | Status: DC
  Filled 2018-03-31 (×3): qty 1

## 2018-03-31 MED ORDER — ROCURONIUM BROMIDE 50 MG/5ML IV SOSY
PREFILLED_SYRINGE | INTRAVENOUS | Status: AC
  Filled 2018-03-31: qty 15

## 2018-03-31 MED ORDER — FENTANYL CITRATE (PF) 100 MCG/2ML IJ SOLN
INTRAMUSCULAR | Status: AC
  Filled 2018-03-31: qty 2

## 2018-03-31 MED ORDER — OXYCODONE HCL 5 MG PO TABS
ORAL_TABLET | ORAL | Status: AC
  Filled 2018-03-31: qty 1

## 2018-03-31 MED ORDER — PHENOL 1.4 % MT LIQD: 1.0000 | OROMUCOSAL | Status: DC | PRN

## 2018-03-31 MED ORDER — MIDAZOLAM HCL 5 MG/5ML IJ SOLN
INTRAMUSCULAR | Status: DC | PRN
  Administered 2018-03-31 (×2): 1 mg via INTRAVENOUS

## 2018-03-31 MED ORDER — CHLORHEXIDINE GLUCONATE CLOTH 2 % EX PADS: 6.0000 | MEDICATED_PAD | Freq: Once | CUTANEOUS | Status: DC

## 2018-03-31 MED ORDER — LIDOCAINE-EPINEPHRINE 1 %-1:100000 IJ SOLN
INTRAMUSCULAR | Status: DC | PRN
  Administered 2018-03-31: 10 mL

## 2018-03-31 MED ORDER — METHOCARBAMOL 500 MG PO TABS
500.0000 mg | ORAL_TABLET | Freq: Four times a day (QID) | ORAL | Status: DC | PRN
  Administered 2018-03-31 – 2018-04-03 (×7): 500 mg via ORAL
  Filled 2018-03-31 (×8): qty 1

## 2018-03-31 MED ORDER — PROPOFOL 10 MG/ML IV BOLUS
INTRAVENOUS | Status: DC | PRN
  Administered 2018-03-31: 150 mg via INTRAVENOUS

## 2018-03-31 MED ORDER — CEFAZOLIN SODIUM-DEXTROSE 2-4 GM/100ML-% IV SOLN
2.0000 g | INTRAVENOUS | Status: AC
  Administered 2018-03-31: 2 g via INTRAVENOUS
  Filled 2018-03-31: qty 100

## 2018-03-31 MED ORDER — EUCERIN EX CREA: TOPICAL_CREAM | CUTANEOUS | Status: DC | PRN

## 2018-03-31 MED ORDER — ONDANSETRON HCL 4 MG PO TABS
4.0000 mg | ORAL_TABLET | Freq: Four times a day (QID) | ORAL | Status: DC | PRN
  Administered 2018-04-01: 4 mg via ORAL
  Filled 2018-03-31: qty 1

## 2018-03-31 MED ORDER — PROPOFOL 10 MG/ML IV BOLUS
INTRAVENOUS | Status: AC
  Filled 2018-03-31: qty 20

## 2018-03-31 MED ORDER — MAGNESIUM OXIDE 400 (241.3 MG) MG PO TABS
400.0000 mg | ORAL_TABLET | Freq: Every day | ORAL | Status: DC
  Filled 2018-03-31 (×6): qty 1

## 2018-03-31 MED ORDER — MENTHOL 3 MG MT LOZG: 1.0000 | LOZENGE | OROMUCOSAL | Status: DC | PRN

## 2018-03-31 MED ORDER — THROMBIN 5000 UNITS EX SOLR
OROMUCOSAL | Status: DC | PRN
  Administered 2018-03-31 (×2): via TOPICAL

## 2018-03-31 MED ORDER — FLEET ENEMA 7-19 GM/118ML RE ENEM
1.0000 | ENEMA | Freq: Once | RECTAL | Status: DC | PRN
  Filled 2018-03-31: qty 1

## 2018-03-31 MED ORDER — SENNA 8.6 MG PO TABS
1.0000 | ORAL_TABLET | Freq: Two times a day (BID) | ORAL | Status: DC
  Administered 2018-03-31 – 2018-04-03 (×6): 8.6 mg via ORAL
  Filled 2018-03-31 (×7): qty 1

## 2018-03-31 MED ORDER — SODIUM CHLORIDE 0.9 % IV SOLN
INTRAVENOUS | Status: DC | PRN
  Administered 2018-03-31: 10:00:00

## 2018-03-31 MED ORDER — SODIUM CHLORIDE 0.9% FLUSH
3.0000 mL | Freq: Two times a day (BID) | INTRAVENOUS | Status: DC
  Administered 2018-03-31 – 2018-04-02 (×4): 3 mL via INTRAVENOUS

## 2018-03-31 MED ORDER — OXYCODONE HCL 5 MG/5ML PO SOLN: 5.0000 mg | Freq: Once | ORAL | Status: AC | PRN

## 2018-03-31 MED ORDER — ONDANSETRON HCL 4 MG/2ML IJ SOLN
INTRAMUSCULAR | Status: DC | PRN
  Administered 2018-03-31 (×2): 4 mg via INTRAVENOUS

## 2018-03-31 MED ORDER — ONDANSETRON HCL 4 MG/2ML IJ SOLN
INTRAMUSCULAR | Status: AC
  Filled 2018-03-31: qty 4

## 2018-03-31 MED ORDER — SODIUM CHLORIDE 0.9 % IV SOLN: 250.0000 mL | INTRAVENOUS | Status: DC

## 2018-03-31 MED ORDER — DEXAMETHASONE SODIUM PHOSPHATE 10 MG/ML IJ SOLN
INTRAMUSCULAR | Status: DC | PRN
  Administered 2018-03-31: 10 mg via INTRAVENOUS

## 2018-03-31 MED ORDER — POLYVINYL ALCOHOL 1.4 % OP SOLN
1.0000 [drp] | OPHTHALMIC | Status: DC | PRN
  Filled 2018-03-31: qty 15

## 2018-03-31 MED ORDER — FENTANYL CITRATE (PF) 250 MCG/5ML IJ SOLN
INTRAMUSCULAR | Status: AC
  Filled 2018-03-31: qty 5

## 2018-03-31 MED ORDER — LIDOCAINE HCL (CARDIAC) PF 100 MG/5ML IV SOSY
PREFILLED_SYRINGE | INTRAVENOUS | Status: DC | PRN
  Administered 2018-03-31: 60 mg via INTRAVENOUS

## 2018-03-31 MED ORDER — LIDOCAINE 2% (20 MG/ML) 5 ML SYRINGE
INTRAMUSCULAR | Status: AC
  Filled 2018-03-31: qty 5

## 2018-03-31 MED ORDER — SUGAMMADEX SODIUM 200 MG/2ML IV SOLN
INTRAVENOUS | Status: DC | PRN
  Administered 2018-03-31: 200 mg via INTRAVENOUS

## 2018-03-31 MED ORDER — LIDOCAINE-EPINEPHRINE 1 %-1:100000 IJ SOLN
INTRAMUSCULAR | Status: AC
  Filled 2018-03-31: qty 1

## 2018-03-31 MED ORDER — BUPIVACAINE HCL (PF) 0.5 % IJ SOLN
INTRAMUSCULAR | Status: DC | PRN
  Administered 2018-03-31: 10 mL
  Administered 2018-03-31: 25 mL

## 2018-03-31 MED ORDER — ROCURONIUM BROMIDE 100 MG/10ML IV SOLN
INTRAVENOUS | Status: DC | PRN
  Administered 2018-03-31: 20 mg via INTRAVENOUS
  Administered 2018-03-31: 50 mg via INTRAVENOUS

## 2018-03-31 MED ORDER — ONDANSETRON HCL 4 MG/2ML IJ SOLN
4.0000 mg | Freq: Four times a day (QID) | INTRAMUSCULAR | Status: DC | PRN
  Administered 2018-03-31 (×2): 4 mg via INTRAVENOUS
  Filled 2018-03-31: qty 2

## 2018-03-31 MED ORDER — ALUM & MAG HYDROXIDE-SIMETH 200-200-20 MG/5ML PO SUSP: 30.0000 mL | Freq: Four times a day (QID) | ORAL | Status: DC | PRN

## 2018-03-31 MED ORDER — METHOCARBAMOL 500 MG PO TABS
ORAL_TABLET | ORAL | Status: AC
  Filled 2018-03-31: qty 1

## 2018-03-31 MED ORDER — BISACODYL 10 MG RE SUPP
10.0000 mg | Freq: Every day | RECTAL | Status: DC | PRN
  Administered 2018-04-02: 10 mg via RECTAL
  Filled 2018-03-31: qty 1

## 2018-03-31 MED ORDER — PHENYLEPHRINE 40 MCG/ML (10ML) SYRINGE FOR IV PUSH (FOR BLOOD PRESSURE SUPPORT)
PREFILLED_SYRINGE | INTRAVENOUS | Status: AC
  Filled 2018-03-31: qty 10

## 2018-03-31 MED ORDER — ACETAMINOPHEN 325 MG PO TABS
650.0000 mg | ORAL_TABLET | ORAL | Status: DC | PRN
  Administered 2018-04-01: 650 mg via ORAL
  Filled 2018-03-31: qty 2

## 2018-03-31 MED ORDER — LACTATED RINGERS IV SOLN
INTRAVENOUS | Status: DC
  Administered 2018-03-31 (×2): via INTRAVENOUS

## 2018-03-31 MED ORDER — FENTANYL CITRATE (PF) 100 MCG/2ML IJ SOLN
25.0000 ug | INTRAMUSCULAR | Status: DC | PRN
  Administered 2018-03-31 (×3): 50 ug via INTRAVENOUS

## 2018-03-31 MED ORDER — DOCUSATE SODIUM 100 MG PO CAPS
100.0000 mg | ORAL_CAPSULE | Freq: Two times a day (BID) | ORAL | Status: DC
  Administered 2018-03-31 – 2018-04-03 (×6): 100 mg via ORAL
  Filled 2018-03-31 (×6): qty 1

## 2018-03-31 MED ORDER — FENTANYL CITRATE (PF) 100 MCG/2ML IJ SOLN
INTRAMUSCULAR | Status: DC | PRN
  Administered 2018-03-31: 50 ug via INTRAVENOUS
  Administered 2018-03-31: 100 ug via INTRAVENOUS
  Administered 2018-03-31 (×3): 50 ug via INTRAVENOUS
  Administered 2018-03-31 (×2): 100 ug via INTRAVENOUS

## 2018-03-31 MED ORDER — PHENYLEPHRINE HCL 10 MG/ML IJ SOLN
INTRAMUSCULAR | Status: DC | PRN
  Administered 2018-03-31 (×3): 80 ug via INTRAVENOUS

## 2018-03-31 MED ORDER — LORATADINE 10 MG PO TABS
10.0000 mg | ORAL_TABLET | Freq: Every day | ORAL | Status: DC
  Filled 2018-03-31 (×3): qty 1

## 2018-03-31 MED ORDER — POTASSIUM CHLORIDE CRYS ER 10 MEQ PO TBCR
10.0000 meq | EXTENDED_RELEASE_TABLET | Freq: Every day | ORAL | Status: DC
  Filled 2018-03-31 (×3): qty 1

## 2018-03-31 MED ORDER — DEXAMETHASONE SODIUM PHOSPHATE 10 MG/ML IJ SOLN
INTRAMUSCULAR | Status: AC
  Filled 2018-03-31: qty 1

## 2018-03-31 MED ORDER — MIDAZOLAM HCL 2 MG/2ML IJ SOLN
INTRAMUSCULAR | Status: AC
  Filled 2018-03-31: qty 2

## 2018-03-31 MED ORDER — LACTATED RINGERS IV SOLN: INTRAVENOUS | Status: DC

## 2018-03-31 MED ORDER — MORPHINE SULFATE (PF) 2 MG/ML IV SOLN
2.0000 mg | INTRAVENOUS | Status: DC | PRN
  Administered 2018-03-31 – 2018-04-01 (×3): 2 mg via INTRAVENOUS
  Filled 2018-03-31 (×3): qty 1

## 2018-03-31 MED ORDER — 0.9 % SODIUM CHLORIDE (POUR BTL) OPTIME
TOPICAL | Status: DC | PRN
  Administered 2018-03-31: 1000 mL

## 2018-03-31 MED ORDER — THROMBIN 5000 UNITS EX SOLR
CUTANEOUS | Status: AC
  Filled 2018-03-31: qty 5000

## 2018-03-31 MED ORDER — HYPROMELLOSE (GONIOSCOPIC) 2.5 % OP SOLN
1.0000 [drp] | Freq: Three times a day (TID) | OPHTHALMIC | Status: DC | PRN
  Filled 2018-03-31: qty 15

## 2018-03-31 MED ORDER — ONDANSETRON HCL 4 MG/2ML IJ SOLN
INTRAMUSCULAR | Status: AC
  Filled 2018-03-31: qty 2

## 2018-03-31 MED ORDER — SODIUM CHLORIDE 0.9 % IV SOLN
INTRAVENOUS | Status: DC | PRN
  Administered 2018-03-31: 30 ug/min via INTRAVENOUS

## 2018-03-31 MED ORDER — OXYCODONE HCL 5 MG PO TABS
5.0000 mg | ORAL_TABLET | Freq: Once | ORAL | Status: AC | PRN
  Administered 2018-03-31: 5 mg via ORAL

## 2018-03-31 MED ORDER — ONDANSETRON HCL 4 MG/2ML IJ SOLN: 4.0000 mg | Freq: Once | INTRAMUSCULAR | Status: DC | PRN

## 2018-03-31 SURGICAL SUPPLY — 71 items
ADH SKN CLS APL DERMABOND .7 (GAUZE/BANDAGES/DRESSINGS) ×1
APL SRG 60D 8 XTD TIP BNDBL (TIP)
BAG DECANTER FOR FLEXI CONT (MISCELLANEOUS) ×2 IMPLANT
BASKET BONE COLLECTION (BASKET) ×2 IMPLANT
BLADE CLIPPER SURG (BLADE) IMPLANT
BONE CANC CHIPS 20CC PCAN1/4 (Bone Implant) ×2 IMPLANT
BUR MATCHSTICK NEURO 3.0 LAGG (BURR) ×2 IMPLANT
CAGE COROENT PLIF 10X28-8 LUMB (Cage) ×4 IMPLANT
CANISTER SUCT 3000ML PPV (MISCELLANEOUS) ×2 IMPLANT
CHIPS CANC BONE 20CC PCAN1/4 (Bone Implant) ×1 IMPLANT
CONT SPEC 4OZ CLIKSEAL STRL BL (MISCELLANEOUS) ×2 IMPLANT
COVER BACK TABLE 60X90IN (DRAPES) ×2 IMPLANT
COVER WAND RF STERILE (DRAPES) ×2 IMPLANT
DECANTER SPIKE VIAL GLASS SM (MISCELLANEOUS) ×2 IMPLANT
DERMABOND ADVANCED (GAUZE/BANDAGES/DRESSINGS) ×1
DERMABOND ADVANCED .7 DNX12 (GAUZE/BANDAGES/DRESSINGS) ×1 IMPLANT
DEVICE DISSECT PLASMABLAD 3.0S (MISCELLANEOUS) ×1 IMPLANT
DRAPE C-ARM 42X72 X-RAY (DRAPES) ×4 IMPLANT
DRAPE HALF SHEET 40X57 (DRAPES) IMPLANT
DRAPE LAPAROTOMY 100X72X124 (DRAPES) ×2 IMPLANT
DURAPREP 26ML APPLICATOR (WOUND CARE) ×2 IMPLANT
DURASEAL APPLICATOR TIP (TIP) IMPLANT
DURASEAL SPINE SEALANT 3ML (MISCELLANEOUS) IMPLANT
ELECT REM PT RETURN 9FT ADLT (ELECTROSURGICAL) ×2
ELECTRODE REM PT RTRN 9FT ADLT (ELECTROSURGICAL) ×1 IMPLANT
GAUZE 4X4 16PLY RFD (DISPOSABLE) IMPLANT
GAUZE SPONGE 4X4 12PLY STRL (GAUZE/BANDAGES/DRESSINGS) ×2 IMPLANT
GLOVE BIOGEL PI IND STRL 7.5 (GLOVE) IMPLANT
GLOVE BIOGEL PI IND STRL 8 (GLOVE) IMPLANT
GLOVE BIOGEL PI IND STRL 8.5 (GLOVE) ×2 IMPLANT
GLOVE BIOGEL PI INDICATOR 7.5 (GLOVE) ×1
GLOVE BIOGEL PI INDICATOR 8 (GLOVE) ×3
GLOVE BIOGEL PI INDICATOR 8.5 (GLOVE) ×2
GLOVE ECLIPSE 7.5 STRL STRAW (GLOVE) ×6 IMPLANT
GLOVE ECLIPSE 8.5 STRL (GLOVE) ×4 IMPLANT
GOWN STRL REUS W/ TWL LRG LVL3 (GOWN DISPOSABLE) IMPLANT
GOWN STRL REUS W/ TWL XL LVL3 (GOWN DISPOSABLE) IMPLANT
GOWN STRL REUS W/TWL 2XL LVL3 (GOWN DISPOSABLE) ×6 IMPLANT
GOWN STRL REUS W/TWL LRG LVL3 (GOWN DISPOSABLE)
GOWN STRL REUS W/TWL XL LVL3 (GOWN DISPOSABLE) ×2
GRAFT BNE CANC CHIPS 1-8 20CC (Bone Implant) IMPLANT
HEMOSTAT POWDER KIT SURGIFOAM (HEMOSTASIS) ×2 IMPLANT
KIT BASIN OR (CUSTOM PROCEDURE TRAY) ×2 IMPLANT
KIT INFUSE MEDIUM (Orthopedic Implant) ×1 IMPLANT
KIT TURNOVER KIT B (KITS) ×2 IMPLANT
MILL MEDIUM DISP (BLADE) ×1 IMPLANT
NDL SPNL 18GX3.5 QUINCKE PK (NEEDLE) IMPLANT
NEEDLE HYPO 22GX1.5 SAFETY (NEEDLE) ×2 IMPLANT
NEEDLE SPNL 18GX3.5 QUINCKE PK (NEEDLE) IMPLANT
NS IRRIG 1000ML POUR BTL (IV SOLUTION) ×2 IMPLANT
PACK LAMINECTOMY NEURO (CUSTOM PROCEDURE TRAY) ×2 IMPLANT
PAD ARMBOARD 7.5X6 YLW CONV (MISCELLANEOUS) ×6 IMPLANT
PATTIES SURGICAL .5 X1 (DISPOSABLE) ×2 IMPLANT
PLASMABLADE 3.0S (MISCELLANEOUS) ×2
ROD RELIN-O LORD 5.5X65MM (Rod) ×1 IMPLANT
ROD RELINE-O LORDOTIC 5.5X60MM (Rod) ×1 IMPLANT
SCREW LOCK RELINE 5.5 TULIP (Screw) ×6 IMPLANT
SCREW RELINE-O POLY 6.5X45 (Screw) ×6 IMPLANT
SPONGE LAP 4X18 RFD (DISPOSABLE) IMPLANT
SPONGE SURGIFOAM ABS GEL 100 (HEMOSTASIS) ×2 IMPLANT
SUT PROLENE 6 0 BV (SUTURE) IMPLANT
SUT VIC AB 1 CT1 18XBRD ANBCTR (SUTURE) ×1 IMPLANT
SUT VIC AB 1 CT1 8-18 (SUTURE) ×2
SUT VIC AB 2-0 CP2 18 (SUTURE) ×2 IMPLANT
SUT VIC AB 3-0 SH 8-18 (SUTURE) ×3 IMPLANT
SYR 3ML LL SCALE MARK (SYRINGE) ×8 IMPLANT
SYR 5ML LL (SYRINGE) IMPLANT
TOWEL GREEN STERILE (TOWEL DISPOSABLE) ×2 IMPLANT
TOWEL GREEN STERILE FF (TOWEL DISPOSABLE) ×2 IMPLANT
TRAY FOLEY MTR SLVR 16FR STAT (SET/KITS/TRAYS/PACK) ×2 IMPLANT
WATER STERILE IRR 1000ML POUR (IV SOLUTION) ×2 IMPLANT

## 2018-03-31 NOTE — Progress Notes (Signed)
Subjective: Patient reports Complains of moderate back pain postoperatively call to get comfortable  Objective: Vital signs in last 24 hours: Temp:  [97.4 F (36.3 C)-97.7 F (36.5 C)] 97.5 F (36.4 C) (02/24 1650) Pulse Rate:  [72-101] 101 (02/24 1650) Resp:  [8-20] 20 (02/24 1650) BP: (96-166)/(65-94) 160/94 (02/24 1650) SpO2:  [95 %-100 %] 96 % (02/24 1650) Weight:  [65.8 kg] 65.8 kg (02/24 0821)  Intake/Output from previous day: No intake/output data recorded. Intake/Output this shift: Total I/O In: 2000 [P.O.:200; I.V.:1800] Out: 200 [Blood:200]  Incision is clean and dry and motor function in the lower extremities is intact  Lab Results: No results for input(s): WBC, HGB, HCT, PLT in the last 72 hours. BMET No results for input(s): NA, K, CL, CO2, GLUCOSE, BUN, CREATININE, CALCIUM in the last 72 hours.  Studies/Results: Dg Lumbar Spine 2-3 Views  Result Date: 03/31/2018 CLINICAL DATA:  Posterior fusion of L3-4 and L4-5. EXAM: DG C-ARM 61-120 MIN; LUMBAR SPINE - 2-3 VIEW Radiation exposure index: 19.25 mGy. COMPARISON:  MRI of January 13, 2018. FINDINGS: Three intraoperative fluoroscopic images of the lower lumbar spine were obtained. These demonstrate the patient be status post surgical posterior fusion of L3-4 and L4-5 with interbody fusion. IMPRESSION: Status post surgical posterior fusion of L3-4 and L4-5. Electronically Signed   By: Marijo Conception, M.D.   On: 03/31/2018 14:32   Dg Lumbar Spine 1 View  Result Date: 03/31/2018 CLINICAL DATA:  Posterior fusion of lower lumbar spine. EXAM: LUMBAR SPINE - 1 VIEW COMPARISON:  MRI of January 13, 2018. FINDINGS: Single intraoperative cross-table lateral projection of the lumbar spine demonstrates surgical probes positioned over the posterior spinous processes of L3 and L4. IMPRESSION: Surgical localization as described above. Electronically Signed   By: Marijo Conception, M.D.   On: 03/31/2018 14:56   Dg C-arm 1-60  Min  Result Date: 03/31/2018 CLINICAL DATA:  Posterior fusion of L3-4 and L4-5. EXAM: DG C-ARM 61-120 MIN; LUMBAR SPINE - 2-3 VIEW Radiation exposure index: 19.25 mGy. COMPARISON:  MRI of January 13, 2018. FINDINGS: Three intraoperative fluoroscopic images of the lower lumbar spine were obtained. These demonstrate the patient be status post surgical posterior fusion of L3-4 and L4-5 with interbody fusion. IMPRESSION: Status post surgical posterior fusion of L3-4 and L4-5. Electronically Signed   By: Marijo Conception, M.D.   On: 03/31/2018 14:32    Assessment/Plan: Stable postop will check pain medication and see if she can have some more Toradol  LOS: 0 days  Ambulate as tolerated tomorrow   Earleen Newport 03/31/2018, 5:32 PM

## 2018-03-31 NOTE — H&P (Signed)
Claudia Dickerson is an 78 y.o. female.   Chief Complaint: Back and bilateral leg pain HPI: Patient is a 78 year old individual whose had bilateral lower extremity pain and weakness.  She has evidence of spondylolisthesis at L4-5 and moderately severe stenosis at L3-L4.  Failing conservative efforts she was advised regarding surgical decompression arthrodesis at L3-4 and L4-5.  Is now admitted for this procedure.  Past Medical History:  Diagnosis Date  . Arthritis   . Complication of anesthesia   . History of kidney stones    IN THE LEFT KIDNEY  . PONV (postoperative nausea and vomiting)    JUST WITH 2 SURGERIES    Past Surgical History:  Procedure Laterality Date  . ABDOMINAL HYSTERECTOMY    . [place] 2005  . EYE SURGERY     BIL CATARACTS    History reviewed. No pertinent family history. Social History:  reports that she quit smoking about 39 years ago. She has never used smokeless tobacco. She reports previous alcohol use. She reports that she does not use drugs.  Allergies:  Allergies  Allergen Reactions  . Amoxicillin Swelling    Did it involve swelling of the face/tongue/throat, SOB, or low BP? Yes Did it involve sudden or severe rash/hives, skin peeling, or any reaction on the inside of your mouth or nose? No Did you need to seek medical attention at a hospital or doctor's office? No When did it last happen?July 2013 If all above answers are "NO", may proceed with cephalosporin use.      Medications Prior to Admission  Medication Sig Dispense Refill  . acetaminophen (TYLENOL) 500 MG tablet Take 1,000 mg by mouth every 6 (six) hours as needed for moderate pain.    Marland Kitchen b complex vitamins tablet Take 1 tablet by mouth daily.    . Calcium Carb-Cholecalciferol (CALCIUM 600 + D PO) Take 1 tablet by mouth daily.    . Cholecalciferol (VITAMIN D) 50 MCG (2000 UT) CAPS Take 2,000 Units by mouth daily.    . diazepam (VALIUM) 5 MG tablet Take 5 mg by  mouth at bedtime as needed for sedation.    . fexofenadine (ALLEGRA) 180 MG tablet Take 180 mg by mouth daily.    . hydroxypropyl methylcellulose / hypromellose (ISOPTO TEARS / GONIOVISC) 2.5 % ophthalmic solution Place 1 drop into both eyes 3 (three) times daily as needed for dry eyes.    . magnesium oxide (MAG-OX) 400 MG tablet Take 400 mg by mouth daily.    . Potassium 99 MG TABS Take 99 mg by mouth daily.    . Probiotic Product (PROBIOTIC DAILY PO) Take 1 tablet by mouth daily.    . traMADol (ULTRAM) 50 MG tablet Take 50 mg by mouth 2 (two) times daily.       No results found for this or any previous visit (from the past 48 hour(s)). No results found.  Review of Systems  Constitutional: Negative.   HENT: Negative.   Eyes: Negative.   Respiratory: Negative.   Genitourinary: Negative.   Musculoskeletal: Positive for back pain.  Skin: Negative.   Neurological: Positive for sensory change and focal weakness.  Endo/Heme/Allergies: Negative.   Psychiatric/Behavioral: Negative.     Blood pressure (!) 166/65, pulse 77, temperature (!) 97.4 F (36.3 C), resp. rate 18, height 5\' 5"  (1.651 m), weight 65.8 kg, SpO2 100 %. Physical Exam  Constitutional: She is oriented to person, place, and time. She appears well-developed and well-nourished.  HENT:  Head: Normocephalic and atraumatic.  Eyes: Pupils are equal, round, and reactive to light. Conjunctivae and EOM are normal.  Neck: Normal range of motion. Neck supple.  Cardiovascular: Normal rate and regular rhythm.  Respiratory: Effort normal and breath sounds normal.  GI: Soft.  Neurological: She is alert and oriented to person, place, and time.  Modest weakness iliopsoas quads tibialis anterior 4 out of 5.  Absent deep tendon reflexes in the patella and Achilles.  Gait is wide-based.  Patient tends to maintain a forward stoop of approximately 10 degrees.  Straight leg raising is positive at 15 degrees Patrick's maneuver is negative  bilaterally.  Absent upper extremity reflexes also cranial nerve examination is normal.  Skin: Skin is warm.  Psychiatric: She has a normal mood and affect. Her behavior is normal. Judgment and thought content normal.     Assessment/Plan Spondylolisthesis and stenosis L3-4 L4-5 with neurogenic claudication and lumbar radiculopathy.  Plan decompression and fusion L3-L5.  Earleen Newport, MD 03/31/2018, 9:37 AM

## 2018-03-31 NOTE — Transfer of Care (Signed)
Immediate Anesthesia Transfer of Care Note  Patient: Claudia Dickerson  Procedure(s) Performed: Lumbar Three-Four Lumbar Four-Five Posterior lumbar interbody fusion (N/A Back)  Patient Location: PACU  Anesthesia Type:General  Level of Consciousness: awake, alert  and oriented  Airway & Oxygen Therapy: Patient Spontanous Breathing and Patient connected to nasal cannula oxygen  Post-op Assessment: Report given to RN, Post -op Vital signs reviewed and stable and Patient moving all extremities  Post vital signs: Reviewed and stable  Last Vitals:  Vitals Value Taken Time  BP 128/71 03/31/2018  2:46 PM  Temp 36.5 C 03/31/2018  2:45 PM  Pulse 76 03/31/2018  2:49 PM  Resp 14 03/31/2018  2:49 PM  SpO2 99 % 03/31/2018  2:49 PM  Vitals shown include unvalidated device data.  Last Pain:  Vitals:   03/31/18 0821  PainSc: 0-No pain      Patients Stated Pain Goal: 7 (96/29/52 8413)  Complications: No apparent anesthesia complications

## 2018-03-31 NOTE — Progress Notes (Signed)
Orthopedic Tech Progress Note Patient Details:  Claudia Dickerson April 12, 1940 [id number] PACU RN said patient has brace Patient ID: Earleen Newport, female   DOB: April 06, 1940, 78 y.o.   MRN: [id number]   Janit Pagan 03/31/2018, 3:16 PM

## 2018-03-31 NOTE — Anesthesia Postprocedure Evaluation (Signed)
Anesthesia Post Note  Patient: Claudia Dickerson  Procedure(s) Performed: Lumbar Three-Four Lumbar Four-Five Posterior lumbar interbody fusion (N/A Back)     Patient location during evaluation: PACU Anesthesia Type: General Level of consciousness: awake and alert Pain management: pain level controlled Vital Signs Assessment: post-procedure vital signs reviewed and stable Respiratory status: spontaneous breathing, nonlabored ventilation and respiratory function stable Cardiovascular status: blood pressure returned to baseline and stable Postop Assessment: no apparent nausea or vomiting Anesthetic complications: no    Last Vitals:  Vitals:   03/31/18 1601 03/31/18 1616  BP: (!) 156/80 (!) 158/85  Pulse: 95 97  Resp: 12 13  Temp:  36.5 C  SpO2: 95% 97%    Last Pain:  Vitals:   03/31/18 0821  PainSc: 0-No pain                 Audry Pili

## 2018-03-31 NOTE — Anesthesia Preprocedure Evaluation (Addendum)
Anesthesia Evaluation  Patient identified by MRN, date of birth, ID band Patient awake    Reviewed: Allergy & Precautions, NPO status , Patient's Chart, lab work & pertinent test results  History of Anesthesia Complications (+) PONV and history of anesthetic complications  Airway Mallampati: I  TM Distance: >3 FB Neck ROM: Full    Dental  (+) Dental Advisory Given, Teeth Intact   Pulmonary former smoker,    breath sounds clear to auscultation       Cardiovascular (-) angina+ DVT (off treatment since 2019)   Rhythm:Regular Rate:Normal     Neuro/Psych negative neurological ROS  negative psych ROS   GI/Hepatic negative GI ROS, Neg liver ROS,   Endo/Other  negative endocrine ROS  Renal/GU negative Renal ROS     Musculoskeletal  (+) Arthritis ,   Abdominal   Peds  Hematology negative hematology ROS (+)   Anesthesia Other Findings Possible anaphylaxis to amoxicillin  Reproductive/Obstetrics                            Anesthesia Physical Anesthesia Plan  ASA: II  Anesthesia Plan: General   Post-op Pain Management:    Induction: Intravenous  PONV Risk Score and Plan: 4 or greater and Treatment may vary due to age or medical condition, Ondansetron and Dexamethasone  Airway Management Planned: Oral ETT  Additional Equipment: None  Intra-op Plan:   Post-operative Plan: Extubation in OR  Informed Consent: I have reviewed the patients History and Physical, chart, labs and discussed the procedure including the risks, benefits and alternatives for the proposed anesthesia with the patient or authorized representative who has indicated his/her understanding and acceptance.     Dental advisory given  Plan Discussed with: CRNA and Anesthesiologist  Anesthesia Plan Comments:        Anesthesia Quick Evaluation

## 2018-03-31 NOTE — Op Note (Signed)
Date of surgery: 03/31/2018 Preoperative diagnosis: Lumbar spondylosis and stenosis with spondylolisthesis L4-L5 stenosis L3-L4 lumbar radiculopathy. Postoperative diagnosis: Same Procedure: Posterior decompression L3-4 and L4-5 with posterior lumbar interbody arthrodesis using peek spacers local autograft and allograft, and infuse.  Posterior fixation from L3-L5 with pedicle screws posterior lateral arthrodesis using local autograft allograft and infuse. Surgeon: Kristeen Miss First assistant: Newman Pies, MD Anesthesia: General endotracheal Indications: Claudia Dickerson is a 78 year old individual has had progressively worsening back and leg pain.  She has evidence of significant spondylosis and stenosis with a degenerative spondylolisthesis at the level of L4-L5.  She was advised regarding the need to decompress L3-4 and L4-5 with posterior stabilization using an interbody technique and posterior lateral arthrodesis.  She is now admitted for that surgery.  Procedure: Patient was brought to the operating room supine on a stretcher.  After the smooth induction of general tracheal anesthesia she was carefully turned prone with the bony prominences appropriately padded and protected.  The back was prepped with alcohol DuraPrep and draped in a sterile fashion.  Midline incision was created in the lower portion lumbar spine and the first identifiable spinous processes were that of L3 and L4.  Then by exposing the entirety of L3 and L4 out to the transverse processes and the facet joints we then were able to perform a laminectomy of L3 removing the inferior margin lamina of L3 out to and including the entirety of the facet at L3-4.  The entire laminar arch of L4 was removed along with the facet joints.  Common dural tube was exposed and carefully we dissected free significant adherence from thickened redundant yellow ligament in this region.  This allowed for good decompression of the central canal and then  individually the L3 the L4 and L5 nerve roots were decompressed.  The disc spaces were then isolated.  Once this was accomplished a complete discectomy was performed at L3-4 and L4-5.  Then at L3-4 10 x 9 mm x 23 mm long spacer with 8 degrees of lordosis was placed and found to fit well this was packed with autograft allograft and infuse a total of 12 cc of autograft allograft and infuse was packed into the interspace along with 2 cages.  At L4-5 a 10 x 9 x 23 mm cage measuring 8 degrees of lordosis was used and again also 12 ml of bone graft was packed into the interspace.  Lateral gutters had been secured with the transverse processes from L3-L5 being decorticated this was packed with an additional 6 mL of bone graft into each lateral gutter.  Then pedicle entry sites were chosen at L3-L4 and L5 and 6.5 x 45 mm screws were placed into each of these pedicles under fluoroscopic guidance.  65 mm precontoured rods were then used to connect the pedicle screws together.  Final radiographs were obtained in AP and lateral projection and showed good neutral positioning of the construct.  With this hemostasis was carefully checked around the nerve roots and the common dural tube and in the posterior soft tissues then the lumbodorsal fascia was reapproximated with #1 Vicryl in interrupted fashion 2-0 Vicryl was used in the subcutaneous tissues and 3-0 Vicryl subcuticularly.  Blood loss for the entire procedure was estimated 200 cc patient was returned to recovery room in stable condition.

## 2018-03-31 NOTE — Anesthesia Procedure Notes (Signed)
Procedure Name: Intubation Date/Time: 03/31/2018 10:25 AM Performed by: Link Burgeson T, CRNA Pre-anesthesia Checklist: Patient identified, Emergency Drugs available, Suction available and Patient being monitored Patient Re-evaluated:Patient Re-evaluated prior to induction Oxygen Delivery Method: Circle system utilized Preoxygenation: Pre-oxygenation with 100% oxygen Induction Type: IV induction Ventilation: Mask ventilation without difficulty Laryngoscope Size: Miller and 2 Grade View: Grade I Tube type: Oral Tube size: 7.5 mm Number of attempts: 1 Airway Equipment and Method: Patient positioned with wedge pillow and Stylet Placement Confirmation: ETT inserted through vocal cords under direct vision,  positive ETCO2 and breath sounds checked- equal and bilateral Secured at: 21 cm Tube secured with: Tape Dental Injury: Teeth and Oropharynx as per pre-operative assessment

## 2018-04-01 LAB — BASIC METABOLIC PANEL
Anion gap: 10 (ref 5–15)
BUN: 14 mg/dL (ref 8–23)
CO2: 25 mmol/L (ref 22–32)
Calcium: 8.7 mg/dL — ABNORMAL LOW (ref 8.9–10.3)
Chloride: 103 mmol/L (ref 98–111)
Creatinine, Ser: 0.92 mg/dL (ref 0.44–1.00)
GFR calc Af Amer: >60 mL/min (ref >60)
GFR calc non Af Amer: >60 mL/min (ref >60)
Glucose, Bld: 140 mg/dL — ABNORMAL HIGH (ref 70–99)
Potassium: 4.5 mmol/L (ref 3.5–5.1)
Sodium: 138 mmol/L (ref 135–145)

## 2018-04-01 LAB — CBC
HEMATOCRIT: 37.6 % (ref 36.0–46.0)
Hemoglobin: 11.7 g/dL — ABNORMAL LOW (ref 12.0–15.0)
MCH: 28.3 pg (ref 26.0–34.0)
MCHC: 31.1 g/dL (ref 30.0–36.0)
MCV: 90.8 fL (ref 80.0–100.0)
Platelets: 225 10*3/uL (ref 150–400)
RBC: 4.14 MIL/uL (ref 3.87–5.11)
RDW: 13.1 % (ref 11.5–15.5)
WBC: 17.2 10*3/uL — ABNORMAL HIGH (ref 4.0–10.5)
nRBC: 0 % (ref 0.0–0.2)

## 2018-04-01 MED ORDER — ENOXAPARIN SODIUM 40 MG/0.4ML ~~LOC~~ SOLN
40.0000 mg | SUBCUTANEOUS | Status: DC
  Administered 2018-04-01 – 2018-04-02 (×2): 40 mg via SUBCUTANEOUS
  Filled 2018-04-01 (×2): qty 0.4

## 2018-04-01 MED ORDER — HYDROCODONE-ACETAMINOPHEN 5-325 MG PO TABS
1.0000 | ORAL_TABLET | ORAL | Status: DC | PRN
  Administered 2018-04-01: 2 via ORAL
  Administered 2018-04-01: 1 via ORAL
  Administered 2018-04-02 (×2): 2 via ORAL
  Filled 2018-04-01: qty 1
  Filled 2018-04-01 (×3): qty 2

## 2018-04-01 NOTE — Social Work (Signed)
CSW acknowledging consult for SNF placement. Will follow for therapy recommendations.   Richy Spradley, MSW, LCSWA [place] Clinical Social Work [phone]   

## 2018-04-01 NOTE — Evaluation (Signed)
Occupational Therapy Evaluation Patient Details Name: Claudia Dickerson MRN: [id number] DOB: 12/28/1940 Today's Date: 04/01/2018    History of Present Illness pt is a 78yo female admitted post L3-L5 PLIF after complaints of bilateral LE pain and weakness. PMH: arthritis, complication of anesthesia, mengioma in 2005   Clinical Impression   PTA independent, working and driving.  Admitted for above and limited by problem list below, including pain, impaired balance and precautions.  Patient educated on precautions, safety, ADL compensatory techniques, body mechanics, and recommendations. Currently requires min assist for bed mobility, min guard for transfers using RW, setup assist for UB ADLs, mod-max assist for LB ADLs.  Patient will benefit from continued OT services while admitted in order to optimize independence and safety with ADLs/mobility, but anticipate no further needs after dc.  Next session to focus on tub transfers and LB AE.      Follow Up Recommendations  No OT follow up;Supervision - Intermittent    Equipment Recommendations  3 in 1 bedside commode    Recommendations for Other Services       Precautions / Restrictions Precautions Precautions: Back Precaution Booklet Issued: Yes (comment) Required Braces or Orthoses: Spinal Brace Spinal Brace: Lumbar corset;Applied in sitting position Restrictions Weight Bearing Restrictions: No      Mobility Bed Mobility Overal bed mobility: Needs Assistance Bed Mobility: Sidelying to Sit   Sidelying to sit: Min assist       General bed mobility comments: cueing for technique, min assist to scoot hips forward at EOB; increased time and effort  Transfers Overall transfer level: Needs assistance Equipment used: Rolling walker (2 wheeled) Transfers: Sit to/from Stand Sit to Stand: Min guard         General transfer comment: cueing for hand placement and posture     Balance Overall balance assessment: Mild deficits  observed, not formally tested                                         ADL either performed or assessed with clinical judgement   ADL Overall ADL's : Needs assistance/impaired     Grooming: Min guard;Standing;Wash/dry hands   Upper Body Bathing: Sitting;Set up   Lower Body Bathing: Minimal assistance;Sit to/from stand   Upper Body Dressing : Set up;Sitting Upper Body Dressing Details (indicate cue type and reason): min assist with brace mgmt  Lower Body Dressing: Minimal assistance;Sit to/from stand Lower Body Dressing Details (indicate cue type and reason): limited ability to complete figure 4 technique due to pain, reviewed compensatory techniques  Toilet Transfer: Min guard;Ambulation;RW Toilet Transfer Details (indicate cue type and reason): simulated to recliner          Functional mobility during ADLs: Min guard;Rolling walker General ADL Comments: pt educated on precautions, ADL compensatory techniques; limited by pain and impaired balance      Vision         Perception     Praxis      Pertinent Vitals/Pain Pain Assessment: 0-10 Pain Score: 6  Pain Location: back Pain Descriptors / Indicators: Aching Pain Intervention(s): Limited activity within patient's tolerance;Premedicated before session;Repositioned     Hand Dominance     Extremity/Trunk Assessment Upper Extremity Assessment Upper Extremity Assessment: Overall WFL for tasks assessed   Lower Extremity Assessment Lower Extremity Assessment: Defer to PT evaluation   Cervical / Trunk Assessment Cervical / Trunk Assessment: Other exceptions Cervical /  Trunk Exceptions: s/p lumbar sx    Communication Communication Communication: No difficulties   Cognition Arousal/Alertness: Awake/alert Behavior During Therapy: WFL for tasks assessed/performed Overall Cognitive Status: Within Functional Limits for tasks assessed                                     General  Comments       Exercises     Shoulder Instructions      Home Living Family/patient expects to be discharged to:: Private residence Living Arrangements: Spouse/significant other Available Help at Discharge: Family;Available 24 hours/day Type of Home: House Home Access: Stairs to enter [employer] of Steps: 4   Home Layout: Multi-level Alternate Level Stairs-Number of Steps: 10, 4   Bathroom Shower/Tub: Teacher, early years/pre: Handicapped height     Home Equipment: Cane - single point          Prior Functioning/Environment Level of Independence: Independent        Comments: working, +driving         OT Problem List: Decreased activity tolerance;Impaired balance (sitting and/or standing);Decreased safety awareness;Decreased knowledge of precautions;Decreased knowledge of use of DME or AE;Pain      OT Treatment/Interventions: Self-care/ADL training;Energy conservation;DME and/or AE instruction;Therapeutic activities;Patient/family education;Balance training    OT Goals(Current goals can be found in the care plan section) Acute Rehab OT Goals Patient Stated Goal: return home OT Goal Formulation: With patient Time For Goal Achievement: 04/15/18 Potential to Achieve Goals: Good  OT Frequency: Min 2X/week   Barriers to D/C:            Co-evaluation              AM-PAC OT "6 Clicks" Daily Activity     Outcome Measure Help from another person eating meals?: None Help from another person taking care of personal grooming?: A Little Help from another person toileting, which includes using toliet, bedpan, or urinal?: A Little Help from another person bathing (including washing, rinsing, drying)?: A Little Help from another person to put on and taking off regular upper body clothing?: None Help from another person to put on and taking off regular lower body clothing?: A Lot 6 Click Score: 19   End of Session Equipment Utilized During  Treatment: Gait belt;Rolling walker;Back brace Nurse Communication: Mobility status  Activity Tolerance: Patient tolerated treatment well Patient left: in chair;with call bell/phone within reach;with family/visitor present  OT Visit Diagnosis: Other abnormalities of gait and mobility (R26.89);Pain Pain - part of body: (back)                Time: 3007-6226 OT Time Calculation (min): 28 min Charges:  OT General Charges $OT Visit: 1 Visit OT Evaluation $OT Eval Moderate Complexity: 1 Mod OT Treatments $Self Care/Home Management : 8-22 mins  Delight Stare, OT Acute Rehabilitation Services Pager [phone] Office [phone]   Delight Stare 04/01/2018, 2:31 PM

## 2018-04-01 NOTE — Progress Notes (Signed)
Patient ID: Claudia Dickerson, female   DOB: 1940-10-05, 78 y.o.   MRN: [id number] Vital signs are stable Patient appears to be having some moderate degrees of pain We will mobilize further today and tomorrow We will ask for pharmacy consult regarding DVT prophylaxis in light of her history of recent DVTs Labs look stable immediately postop Consider discharge tomorrow

## 2018-04-01 NOTE — Progress Notes (Signed)
ANTICOAGULATION CONSULT NOTE - Initial Consult  Pharmacy Consult for Anticoagulation Indication: VTE prophylaxis  Allergies  Allergen Reactions  . Amoxicillin Swelling    Did it involve swelling of the face/tongue/throat, SOB, or low BP? Yes Did it involve sudden or severe rash/hives, skin peeling, or any reaction on the inside of your mouth or nose? No Did you need to seek medical attention at a hospital or doctor's office? No When did it last happen?July 2013 If all above answers are "NO", may proceed with cephalosporin use.      Patient Measurements: Height: 5\' 5"  (165.1 cm) Weight: 145 lb (65.8 kg) IBW/kg (Calculated) : 57  Vital Signs: Temp: 98.1 F (36.7 C) (02/25 1549) Temp Source: Oral (02/25 0829) BP: 130/51 (02/25 1549) Pulse Rate: 87 (02/25 1549)  Labs: Recent Labs    04/01/18 0649  HGB 11.7*  HCT 37.6  PLT 225  CREATININE 0.92    Estimated Creatinine Clearance: 46.8 mL/min (by C-G formula based on SCr of 0.92 mg/dL).   Medical History: Past Medical History:  Diagnosis Date  . Arthritis   . Complication of anesthesia   . History of kidney stones    IN THE LEFT KIDNEY  . PONV (postoperative nausea and vomiting)    JUST WITH 2 SURGERIES    Assessment: 78 year old individual whose had bilateral lower extremity pain and weakness.  S/p surgery on 03/31/18 - procedure: Posterior decompression L3-4 and L4-5 with posterior lumbar interbody arthrodesis using peek spacers local autograft and allograft, and infuse.  Posterior fixation from L3-L5 with pedicle screws posterior lateral arthrodesis using local autograft allograft and infuse. Patient has a H/O DVT back in 2019; Had been on eliquis, but that was stopped in Oct. 2019.  Pharmacy has been consulted for anticoagulation recommendations for VTE prophylaxis in this patient.  Goal of Therapy:Monitor platelets by anticoagulation protocol: Yes   Plan:  Lovenox 40 mg subq q24hr for prophylaxis Monitor  Hgb and Plts while on therapy   Alanda Slim, PharmD, [facility] Clinical Pharmacist Please see AMION for all Pharmacists' Contact Phone Numbers 04/01/2018, 5:16 PM

## 2018-04-01 NOTE — Evaluation (Signed)
Physical Therapy Evaluation Patient Details Name: Claudia Dickerson MRN: [id number] DOB: 1940-04-07 Today's Date: 04/01/2018   History of Present Illness  pt is a 78yo female admitted post L3-L5 PLIF after complaints of bilateral LE pain and weakness. PMH: arthritis, complication of anesthesia, mengioma in 2005  Clinical Impression  Pt is a 78yo female admitted for above. Pt in recliner upon arrival having already gotten up out of bed and to the bathroom with assistance from her daughters. Pt presents with pain, decreased activity tolerance and functional mobility that is limiting her safe return home. Pt educated on activity progression and performance of daily tasks within precautions with two daughters present. Pt would benefit from skilled acute therapy to address deficits and improve functional mobility and independence while decreasing caregiver burden. Pt and daughters educated on plan and consented.     Follow Up Recommendations No PT follow up    Equipment Recommendations  Rolling walker with 5" wheels    Recommendations for Other Services OT consult     Precautions / Restrictions Precautions Precautions: Back Precaution Booklet Issued: Yes (comment) Required Braces or Orthoses: Spinal Brace Spinal Brace: Lumbar corset;Applied in sitting position      Mobility  Bed Mobility Overal bed mobility: Needs Assistance Bed Mobility: Sit to Sidelying;Sidelying to Sit;Rolling Rolling: Min assist Sidelying to sit: Min guard     Sit to sidelying: Min assist General bed mobility comments: cues for sequence with use of rail, guarding for precautions. Assist to lift legs to surface with sit to sidelying  Transfers Overall transfer level: Needs assistance   Transfers: Sit to/from Stand Sit to Stand: Min assist         General transfer comment: cues for hand placement on armrests to stand  Ambulation/Gait Ambulation/Gait assistance: Min guard Gait Distance (Feet): 200  Feet Assistive device: Rolling walker (2 wheeled) Gait Pattern/deviations: Step-through pattern;Decreased stride length;Wide base of support   Gait velocity interpretation: 1.31 - 2.62 ft/sec, indicative of limited community ambulator General Gait Details: cues for posture and position in RW  Stairs            Wheelchair Mobility    Modified Rankin (Stroke Patients Only)       Balance Overall balance assessment: Mild deficits observed, not formally tested                                           Pertinent Vitals/Pain Pain Assessment: 0-10 Pain Score: 7  Pain Location: back Pain Descriptors / Indicators: Aching Pain Intervention(s): Limited activity within patient's tolerance;Premedicated before session;Monitored during session;Repositioned    Home Living Family/patient expects to be discharged to:: Private residence Living Arrangements: Spouse/significant other Available Help at Discharge: Family;Available 24 hours/day Type of Home: House Home Access: Stairs to enter   [employer] of Steps: 4 Home Layout: Multi-level Home Equipment: Cane - single point      Prior Function Level of Independence: Independent               Hand Dominance        Extremity/Trunk Assessment   Upper Extremity Assessment Upper Extremity Assessment: Overall WFL for tasks assessed    Lower Extremity Assessment Lower Extremity Assessment: Overall WFL for tasks assessed    Cervical / Trunk Assessment Cervical / Trunk Assessment: Other exceptions Cervical / Trunk Exceptions: post surgical  Communication   Communication: No difficulties  Cognition  Arousal/Alertness: Awake/alert Behavior During Therapy: WFL for tasks assessed/performed Overall Cognitive Status: Within Functional Limits for tasks assessed                                        General Comments      Exercises     Assessment/Plan    PT Assessment  Patient needs continued PT services  PT Problem List Decreased strength;Decreased balance;Decreased knowledge of precautions;Pain;Decreased mobility;Decreased knowledge of use of DME;Decreased activity tolerance       PT Treatment Interventions DME instruction;Functional mobility training;Patient/family education;Gait training;Therapeutic activities;Stair training;Therapeutic exercise    PT Goals (Current goals can be found in the Care Plan section)  Acute Rehab PT Goals Patient Stated Goal: return home PT Goal Formulation: With patient/family Time For Goal Achievement: 04/08/18 Potential to Achieve Goals: Good    Frequency Min 5X/week   Barriers to discharge        Co-evaluation               AM-PAC PT "6 Clicks" Mobility  Outcome Measure Help needed turning from your back to your side while in a flat bed without using bedrails?: A Little Help needed moving from lying on your back to sitting on the side of a flat bed without using bedrails?: A Little Help needed moving to and from a bed to a chair (including a wheelchair)?: A Little Help needed standing up from a chair using your arms (e.g., wheelchair or bedside chair)?: A Little Help needed to walk in hospital room?: A Little Help needed climbing 3-5 steps with a railing? : A Little 6 Click Score: 18    End of Session Equipment Utilized During Treatment: Gait belt;Back brace Activity Tolerance: Patient tolerated treatment well Patient left: in chair;with call bell/phone within reach;with family/visitor present Nurse Communication: Mobility status;Precautions PT Visit Diagnosis: Other abnormalities of gait and mobility (R26.89);Muscle weakness (generalized) (M62.81);Difficulty in walking, not elsewhere classified (R26.2)    Time: 0725-0755 PT Time Calculation (min) (ACUTE ONLY): 30 min   Charges:   PT Evaluation $PT Eval Moderate Complexity: 1 Mod PT Treatments $Gait Training: 8-22 mins        [place],  [place] [phone]   Margaretha Mahan 04/01/2018, 9:19 AM

## 2018-04-02 MED ORDER — KETOROLAC TROMETHAMINE 15 MG/ML IJ SOLN
15.0000 mg | Freq: Four times a day (QID) | INTRAMUSCULAR | Status: DC
  Administered 2018-04-02: 15 mg via INTRAVENOUS
  Filled 2018-04-02: qty 1

## 2018-04-02 MED ORDER — MAGNESIUM CITRATE PO SOLN
1.0000 | Freq: Once | ORAL | Status: AC
  Administered 2018-04-02: 1 via ORAL
  Filled 2018-04-02: qty 296

## 2018-04-02 MED ORDER — TRAMADOL HCL 50 MG PO TABS
50.0000 mg | ORAL_TABLET | Freq: Four times a day (QID) | ORAL | Status: DC | PRN
  Administered 2018-04-02: 100 mg via ORAL
  Administered 2018-04-03 (×2): 50 mg via ORAL
  Administered 2018-04-03: 100 mg via ORAL
  Filled 2018-04-02 (×2): qty 1
  Filled 2018-04-02 (×2): qty 2

## 2018-04-02 MED FILL — Sodium Chloride IV Soln 0.9%: INTRAVENOUS | Qty: 1000 | Status: AC

## 2018-04-02 MED FILL — Heparin Sodium (Porcine) Inj 1000 Unit/ML: INTRAMUSCULAR | Qty: 30 | Status: AC

## 2018-04-02 NOTE — Progress Notes (Addendum)
Physical Therapy Treatment Patient Details Name: Claudia Dickerson MRN: [id number] DOB: 01-17-1941 Today's Date: 04/02/2018    History of Present Illness Pt is a 78yo female admitted post L3-L5 PLIF after complaints of bilateral LE pain and weakness. PMH: arthritis, complication of anesthesia, mengioma in 2005    PT Comments    Pt progressing well.  Reports feeling bloated. Able to recall 3/3 spinal precautions.  Pt remains appropriate to return home.  Daughter educated on brace application.      Follow Up Recommendations  No PT follow up     Equipment Recommendations  Rolling walker with 5" wheels    Recommendations for Other Services OT consult     Precautions / Restrictions Precautions Precautions: Back Precaution Booklet Issued: Yes (comment) Precaution Comments: reviewed precautions with patient, educated on importance of wearing brace while sitting  Required Braces or Orthoses: Spinal Brace Spinal Brace Comments: Pt and daughter educated on use of spinal brace.  Pt participated in donning and doffing brace.   Restrictions Weight Bearing Restrictions: No    Mobility  Bed Mobility Overal bed mobility: Needs Assistance Bed Mobility: Sidelying to Sit Rolling: Min assist Sidelying to sit: Min assist     Sit to sidelying: Min assist General bed mobility comments: Cues for hand placement and body position with min assistance to lift LE and elevate trunk.    Transfers Overall transfer level: Needs assistance Equipment used: Rolling walker (2 wheeled) Transfers: Sit to/from Stand Sit to Stand: Min guard         General transfer comment: Cues for hand placement to and from seated surface.  Adjusted brace for fit in standing.    Ambulation/Gait Ambulation/Gait assistance: Min guard Gait Distance (Feet): 200 Feet Assistive device: Rolling walker (2 wheeled) Gait Pattern/deviations: Step-through pattern;Decreased stride length;Wide base of support     General  Gait Details: cues for posture and position in RW   Stairs             Wheelchair Mobility    Modified Rankin (Stroke Patients Only)       Balance Overall balance assessment: Mild deficits observed, not formally tested                                          Cognition Arousal/Alertness: Awake/alert Behavior During Therapy: WFL for tasks assessed/performed Overall Cognitive Status: Within Functional Limits for tasks assessed                                        Exercises Other Exercises Other Exercises: sidelying and supine positional neural glides to reduce discomfort in B hips and legs.  1x10 each position.     General Comments        Pertinent Vitals/Pain Pain Assessment: 0-10 Pain Score: 6  Pain Location: back Pain Descriptors / Indicators: Aching Pain Intervention(s): Monitored during session;Repositioned    Home Living                      Prior Function            PT Goals (current goals can now be found in the care plan section) Acute Rehab PT Goals Patient Stated Goal: return home Potential to Achieve Goals: Good Progress towards PT goals: Progressing toward goals  Frequency    Min 5X/week      PT Plan Current plan remains appropriate    Co-evaluation              AM-PAC PT "6 Clicks" Mobility   Outcome Measure  Help needed turning from your back to your side while in a flat bed without using bedrails?: A Little Help needed moving from lying on your back to sitting on the side of a flat bed without using bedrails?: A Little Help needed moving to and from a bed to a chair (including a wheelchair)?: A Little Help needed standing up from a chair using your arms (e.g., wheelchair or bedside chair)?: A Little Help needed to walk in hospital room?: A Little Help needed climbing 3-5 steps with a railing? : A Little 6 Click Score: 18    End of Session Equipment Utilized During  Treatment: Gait belt;Back brace Activity Tolerance: Patient tolerated treatment well Patient left: in chair;with call bell/phone within reach;with family/visitor present Nurse Communication: Mobility status;Precautions PT Visit Diagnosis: Other abnormalities of gait and mobility (R26.89);Muscle weakness (generalized) (M62.81);Difficulty in walking, not elsewhere classified (R26.2)     Time: 3762-8315 PT Time Calculation (min) (ACUTE ONLY): 33 min  Charges:  $Gait Training: 8-22 mins $Therapeutic Activity: 8-22 mins                     Governor Rooks, PTA Acute Rehabilitation Services Pager [phone] Office [phone]     Leston Schueller Eli Hose 04/02/2018, 5:04 PM

## 2018-04-02 NOTE — Care Management Note (Signed)
Case Management Note  Patient Details  Name: ALIANA KREISCHER MRN: [id number] Date of Birth: [birth year]  Subjective/Objective: Pt is a 78yo female admitted post L3-L5 PLIF after complaints of bilateral LE pain and weakness.  PTA, pt independent, lives at home with spouse.                    Action/Plan: PT/OT recommending no OP follow up, DME for home.  Referral to [facility] for 3 in 1 and RW, to be delivered to bedside prior to dc.  Family able to provide 24h assistance at dc.  Expected Discharge Date:                  Expected Discharge Plan:  Home/Self Care  In-House Referral:     Discharge planning Services  CM Consult  Post Acute Care Choice:  Durable Medical Equipment Choice offered to:     DME Arranged:  3-N-1, Walker rolling DME Agency:  [place]:    [place]:     Status of Service:  Completed, signed off  If discussed at [place] of Stay Meetings, dates discussed:    Additional Comments:  Reinaldo Raddle, RN, BSN  Trauma/Neuro ICU Case Manager [phone]

## 2018-04-02 NOTE — Progress Notes (Signed)
PT Cancellation Note  Patient Details Name: Claudia Dickerson MRN: [id number] DOB: 1940/04/10   Cancelled Treatment:    Reason Eval/Treat Not Completed: Medical issues which prohibited therapy. Pt declining participation in therapy this AM due to nausea. Pt/family still hopeful for d/c home today. Pt has 4 steps to enter her house. PT to re-attempt as time allows.    Claudia Dickerson 04/02/2018, 10:56 AM  Lorrin Goodell, PT  Office # [phone] Pager [phone]

## 2018-04-02 NOTE — Progress Notes (Addendum)
Occupational Therapy Treatment Patient Details Name: Claudia Dickerson MRN: [id number] DOB: [birth year] Today's Date: 04/02/2018    History of present illness Pt is a 78yo female admitted post L3-L5 PLIF after complaints of bilateral LE pain and weakness. PMH: arthritis, complication of anesthesia, mengioma in 2005   OT comments  Patient seated in recliner and agreeable to in room session with OT.  Patient not wearing back brace, reporting uncomfortable to her stomach due to need to have BM.  Encouraged donning and educated on precautions/MD brace recommendations, but patient declines. Educated on LB AE, and return demonstrates with supervision to don/doff socks (verbally educated on threading underwear/pants with reacher).  Therapist demonstrated use of RW and 3:1 for tub transfers using reverse step, patient declining to engage in practice at this time but daughter present and understanding technique.  RN aware of pain, nausea. Patient will benefit from continued OT, plan to practice tub transfer next session.    Follow Up Recommendations  No OT follow up;Supervision - Intermittent    Equipment Recommendations  3 in 1 bedside commode    Recommendations for Other Services      Precautions / Restrictions Precautions Precautions: Back Precaution Booklet Issued: Yes (comment) Precaution Comments: reviewed precautions with patient, educated on importance of wearing brace while sitting  Required Braces or Orthoses: Spinal Brace Spinal Brace: Other (comment) Spinal Brace Comments: patient seated in recliner upon entry, refused to don brace is sitting even after education of precautions and recommendations of MD Restrictions Weight Bearing Restrictions: No       Mobility Bed Mobility               General bed mobility comments: OOB in recliner upon entry  Transfers                 General transfer comment: deferred, as patient declines to don brace- limited by stomach  pain and nausea     Balance Overall balance assessment: Mild deficits observed, not formally tested                                         ADL either performed or assessed with clinical judgement   ADL Overall ADL's : Needs assistance/impaired             Lower Body Bathing: Minimal assistance;Sitting/lateral leans Lower Body Bathing Details (indicate cue type and reason): reviewed techniques with AE, seated for safety and precautions- pt has long sponge at home she plans to use       Lower Body Dressing: Minimal assistance;With adaptive equipment Lower Body Dressing Details (indicate cue type and reason): seated in recliner, reviewed use of AE and return demonstrated using reacher to doff socks and sock aide to don socks; verbally educated on use of reacher to thread LEs into pants            Tub/Shower Transfer Details (indicate cue type and reason): demonstrated use of 3:1 for shower chair, tub transfers using RW and reverse step technique- daughter present and verbalized understanding but pt declines to practice at this time    General ADL Comments: pt remains sitting in recliner during session, refuses to don brace and limited by nausea this morning (reports brace hurts stomach- 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      Cognition  Arousal/Alertness: Awake/alert Behavior During Therapy: WFL for tasks assessed/performed Overall Cognitive Status: Within Functional Limits for tasks assessed                                          Exercises     Shoulder Instructions       General Comments daughter present and supportive    Pertinent Vitals/ Pain       Pain Assessment: 0-10 Pain Score: 6  Pain Location: back Pain Descriptors / Indicators: Aching Pain Intervention(s): Monitored during session;Limited activity within patient's tolerance  Home Living                                           Prior Functioning/Environment              Frequency  Min 2X/week        Progress Toward Goals  OT Goals(current goals can now be found in the care plan section)  Progress towards OT goals: Progressing toward goals  Acute Rehab OT Goals Patient Stated Goal: return home OT Goal Formulation: With patient  Plan Discharge plan remains appropriate;Frequency remains appropriate    Co-evaluation                 AM-PAC OT "6 Clicks" Daily Activity     Outcome Measure   Help from another person eating meals?: None Help from another person taking care of personal grooming?: A Little Help from another person toileting, which includes using toliet, bedpan, or urinal?: A Little Help from another person bathing (including washing, rinsing, drying)?: A Little Help from another person to put on and taking off regular upper body clothing?: None Help from another person to put on and taking off regular lower body clothing?: A Little 6 Click Score: 20    End of Session    OT Visit Diagnosis: Other abnormalities of gait and mobility (R26.89);Pain Pain - part of body: (back)   Activity Tolerance Other (comment)(limited by nausea )   Patient Left in chair;with call bell/phone within reach;with family/visitor present   Nurse Communication Mobility status;Other (comment)(nausea )        Time: 4315-4008 OT Time Calculation (min): 21 min  Charges: OT General Charges $OT Visit: 1 Visit OT Treatments $Self Care/Home Management : 8-22 mins  Delight Stare, [place] Pager [phone] Office [phone]    Delight Stare 04/02/2018, 10:05 AM

## 2018-04-02 NOTE — Progress Notes (Signed)
Patient ID: Claudia Dickerson, female   DOB: 03-22-1940, 78 y.o.   MRN: [id number] Vital signs are stable and patient has not had a good day.  She notes that the hydrocodone is too strong for her and she has been sleeping most of the day her bowels have not moved yet she finds that she tolerates the tramadol better.  She has had bilateral lateral hip pain consistent with trochanteric bursitis none uncommon feature I will give her a dose of Toradol to see if this will help to ease this.  We will see if her bowels can be advanced with the use of some suppositories or an enema if necessary we will see if she feels better tomorrow and perhaps be ready for discharge

## 2018-04-03 MED ORDER — TRAMADOL HCL 50 MG PO TABS: 50.0000 mg | ORAL_TABLET | Freq: Four times a day (QID) | ORAL | 1 refills | Status: DC | PRN

## 2018-04-03 MED ORDER — METHOCARBAMOL 500 MG PO TABS: 500.0000 mg | ORAL_TABLET | Freq: Four times a day (QID) | ORAL | 3 refills | Status: DC | PRN

## 2018-04-03 NOTE — Progress Notes (Addendum)
Pt has discharge orders, reviewed AVS summary, and medicated for pain. Went over back  precautions, incentive spirometer, sitting time, discharge orders, when to call MD, follow up appointment in 2-3 weeks. Daughter with patient.  Simmie Davies RN   Daughter reviewed AVS summary, Rx's to correct pharmacy, PT reviewed precautions with teach back and walked to steps once before discharge. Incision clean, dry and intact. No further questions by family/patient.   Simmie Davies RN

## 2018-04-03 NOTE — Progress Notes (Signed)
After flushing patient's IV after giving IV toradol vein blew.  It was patient's only IV access and patient is expecting to be discharged tomorrow therefore she does not want another IV started.

## 2018-04-03 NOTE — Progress Notes (Signed)
Physical Therapy Treatment Patient Details Name: Claudia Dickerson MRN: [id number] DOB: 07-Sep-1940 Today's Date: 04/03/2018    History of Present Illness Pt is a 78yo female admitted post L3-L5 PLIF after complaints of bilateral LE pain and weakness. PMH: arthritis, complication of anesthesia, mengioma in 2005    PT Comments    Pt up in recliner and preparing for discharge with daughter and nurse upon arrival and agreed to participate with therapy. Pt educated on car transfers, activity progression and limiting how many times she has to go up/down her stairs at home for the first few days with pt and daughter verbalizing understanding.   Follow Up Recommendations  No PT follow up     Equipment Recommendations  Rolling walker with 5" wheels    Recommendations for Other Services       Precautions / Restrictions Precautions Precautions: Back Precaution Comments: pt able to recall all precautions  Required Braces or Orthoses: Spinal Brace Spinal Brace Comments: pt able to don brace independently, pt able to recall when she must wear brace  Restrictions Weight Bearing Restrictions: No    Mobility  Bed Mobility               General bed mobility comments: pt up in recliner preparing for discharge upon arrival   Transfers Overall transfer level: Modified independent Equipment used: Rolling walker (2 wheeled) Transfers: Sit to/from Stand Sit to Stand: Modified independent (Device/Increase time)            Ambulation/Gait Ambulation/Gait assistance: Supervision Gait Distance (Feet): 250 Feet Assistive device: Rolling walker (2 wheeled) Gait Pattern/deviations: Step-through pattern;Decreased stride length;Trunk flexed   Gait velocity interpretation: 1.31 - 2.62 ft/sec, indicative of limited community ambulator General Gait Details: cues for posture and position in walker, cues for relaxing shoulders    Stairs Stairs: Yes Stairs assistance: Supervision Stair  Management: One rail Right;Sideways;Step to pattern Number of Stairs: 11 General stair comments: pt required cuing for ascending/descending stairs sideways in order to use both UE on handrail and not break precautions   Wheelchair Mobility    Modified Rankin (Stroke Patients Only)       Balance Overall balance assessment: Mild deficits observed, not formally tested                                          Cognition Arousal/Alertness: Awake/alert Behavior During Therapy: WFL for tasks assessed/performed Overall Cognitive Status: Within Functional Limits for tasks assessed                                        Exercises      General Comments        Pertinent Vitals/Pain Pain Assessment: Faces Faces Pain Scale: Hurts even more Pain Descriptors / Indicators: Aching Pain Intervention(s): Monitored during session;Premedicated before session;Limited activity within patient's tolerance    Home Living                      Prior Function            PT Goals (current goals can now be found in the care plan section) Progress towards PT goals: Progressing toward goals    Frequency    Min 5X/week      PT Plan Current plan remains appropriate  Co-evaluation              AM-PAC PT "6 Clicks" Mobility   Outcome Measure  Help needed turning from your back to your side while in a flat bed without using bedrails?: None Help needed moving from lying on your back to sitting on the side of a flat bed without using bedrails?: None Help needed moving to and from a bed to a chair (including a wheelchair)?: A Little Help needed standing up from a chair using your arms (e.g., wheelchair or bedside chair)?: A Little Help needed to walk in hospital room?: A Little Help needed climbing 3-5 steps with a railing? : A Little 6 Click Score: 20    End of Session Equipment Utilized During Treatment: Gait belt;Back brace Activity  Tolerance: Patient tolerated treatment well Patient left: in chair;with family/visitor present;with call bell/phone within reach Nurse Communication: Mobility status;Patient requests pain meds PT Visit Diagnosis: Other abnormalities of gait and mobility (R26.89);Muscle weakness (generalized) (M62.81);Difficulty in walking, not elsewhere classified (R26.2)     Time: 3374-4514 PT Time Calculation (min) (ACUTE ONLY): 23 min  Charges:  $Gait Training: 8-22 mins $Therapeutic Activity: 8-22 mins                     [place], [place] [phone]    [name] 04/03/2018, 11:07 AM

## 2018-04-03 NOTE — Discharge Summary (Signed)
Physician Discharge Summary  Patient ID: Claudia Dickerson MRN: [id number] DOB/AGE: [birth year] 77 y.o.  Admit date: 03/31/2018 Discharge date: 04/03/2018  Admission Diagnoses: Lumbar spondylosis and stenosis with radiculopathy L3-L5, neurogenic claudication, lumbar radiculopathy  Discharge Diagnoses: Lumbar spondylosis and stenosis with radiculopathy L3-L5, neurogenic claudication, lumbar radiculopathy.  History of deep venous thrombosis, recent Active Problems:   Lumbar stenosis with neurogenic claudication   Discharged Condition: good  Hospital Course: Patient was admitted to undergo surgical decompression and stabilization from L3-L5 which she tolerated well.  She is had some constipation during the postoperative course but she is ambulatory.  She has been treated with some Lovenox injections for DVT prophylaxis and this will be continued with Xarelto at home.  I will see her in 2 to 3 weeks time for further follow-up.  Consults: None, pharmacy  Significant Diagnostic Studies: None  Treatments: surgery: Decompression L3-4 and L4-L5 with posterior fixation L3-L5.  Discharge Exam: Blood pressure (!) 138/58, pulse 75, temperature 98.2 F (36.8 C), temperature source Oral, resp. rate 18, height 5\' 5"  (1.651 m), weight 65.8 kg, SpO2 95 %. Incisions clean and dry and station and gait is intact.  Disposition: Discharge disposition: 01-Home or Self Care       Discharge Instructions    Call MD for:  redness, tenderness, or signs of infection (pain, swelling, redness, odor or green/yellow discharge around incision site)   Complete by:  As directed    Call MD for:  severe uncontrolled pain   Complete by:  As directed    Call MD for:  temperature >100.4   Complete by:  As directed    Diet - low sodium heart healthy   Complete by:  As directed    Discharge instructions   Complete by:  As directed    Okay to shower. Do not apply salves or appointments to incision. No heavy lifting  with the upper extremities greater than 15 pounds. May resume driving when not requiring pain medication and patient feels comfortable with doing so.   Incentive spirometry RT   Complete by:  As directed    Increase activity slowly   Complete by:  As directed      Allergies as of 04/03/2018      Reactions   Amoxicillin Swelling   Did it involve swelling of the face/tongue/throat, SOB, or low BP? Yes Did it involve sudden or severe rash/hives, skin peeling, or any reaction on the inside of your mouth or nose? No Did you need to seek medical attention at a hospital or doctor's office? No When did it last happen?July 2013 If all above answers are "NO", may proceed with cephalosporin use.      Medication List    TAKE these medications   acetaminophen 500 MG tablet Commonly known as:  TYLENOL Take 1,000 mg by mouth every 6 (six) hours as needed for moderate pain.   b complex vitamins tablet Take 1 tablet by mouth daily.   CALCIUM 600 + D PO Take 1 tablet by mouth daily.   fexofenadine 180 MG tablet Commonly known as:  ALLEGRA Take 180 mg by mouth daily.   hydroxypropyl methylcellulose / hypromellose 2.5 % ophthalmic solution Commonly known as:  ISOPTO TEARS / GONIOVISC Place 1 drop into both eyes 3 (three) times daily as needed for dry eyes.   magnesium oxide 400 MG tablet Commonly known as:  MAG-OX Take 400 mg by mouth daily.   methocarbamol 500 MG tablet Commonly known as:  ROBAXIN Take  1 tablet (500 mg total) by mouth every 6 (six) hours as needed for muscle spasms.   Potassium 99 MG Tabs Take 99 mg by mouth daily.   PROBIOTIC DAILY PO Take 1 tablet by mouth daily.   traMADol 50 MG tablet Commonly known as:  ULTRAM Take 1-2 tablets (50-100 mg total) by mouth every 6 (six) hours as needed for moderate pain or severe pain. What changed:    how much to take  when to take this  reasons to take this   VALIUM 5 MG tablet Generic drug:  diazepam Take 5 mg  by mouth at bedtime as needed for sedation.   Vitamin D 50 MCG (2000 UT) Caps Take 2,000 Units by mouth daily.            Durable Medical Equipment  (From admission, onward)         Start     Ordered   04/03/18 1657  DME 3-in-1  Once     04/03/18 9038   04/03/18 0927  For home use only DME Walker rolling  [facility])  Once    Question:  Patient needs a walker to treat with the following condition  Answer:  Lumbar stenosis with neurogenic claudication   04/03/18 3338   04/02/18 1511  For home use only DME Walker rolling  Once    Question:  Patient needs a walker to treat with the following condition  Answer:  Spondylosis of lumbar spine   04/02/18 1511   04/02/18 1511  For home use only DME Bedside commode  Once    Question:  Patient needs a bedside commode to treat with the following condition  Answer:  Spondylosis of lumbar spine   04/02/18 1511           Signed: Earleen Newport 04/03/2018, 9:28 AM

## 2018-04-07 NOTE — Consult Note (Signed)
            [facility] CM Primary Care Navigator  04/07/2018  Claudia Dickerson 02/26/1940 [id number]   Attempt to seepatient at the bedside to identify possible discharge needs but she was alreadydischargedhometowards the weekend.  Per MD note,patient was admitted for lumbar spondylosis and stenosis with radiculopathy L3-L5, neurogenic claudication, lumbar radiculopathy status post decompression L3-4 and L4-L5 with posterior fixation L3-L5  Patient has discharge instruction to follow-up withneurosurgery within 2- 3 weeks.  Primary care provider's office is listed as providing transition of care (TOC) follow -up.    For additional questions please contact:  Edwena Felty A. Mitali Shenefield, BSN, RN-BC [facility] PRIMARY CARE Navigator Cell: [phone]

## 2018-04-18 DIAGNOSIS — Z6825 Body mass index (BMI) 25.0-25.9, adult: Secondary | ICD-10-CM | POA: Diagnosis not present

## 2018-04-18 DIAGNOSIS — R030 Elevated blood-pressure reading, without diagnosis of hypertension: Secondary | ICD-10-CM | POA: Diagnosis not present

## 2018-04-18 DIAGNOSIS — M48062 Spinal stenosis, lumbar region with neurogenic claudication: Secondary | ICD-10-CM | POA: Diagnosis not present

## 2018-06-18 DIAGNOSIS — M48062 Spinal stenosis, lumbar region with neurogenic claudication: Secondary | ICD-10-CM | POA: Diagnosis not present

## 2018-06-23 DIAGNOSIS — E782 Mixed hyperlipidemia: Secondary | ICD-10-CM | POA: Diagnosis not present

## 2018-06-23 DIAGNOSIS — Z1159 Encounter for screening for other viral diseases: Secondary | ICD-10-CM | POA: Diagnosis not present

## 2018-06-26 DIAGNOSIS — R5383 Other fatigue: Secondary | ICD-10-CM | POA: Diagnosis not present

## 2018-06-26 DIAGNOSIS — E782 Mixed hyperlipidemia: Secondary | ICD-10-CM | POA: Diagnosis not present

## 2018-08-04 DIAGNOSIS — H04123 Dry eye syndrome of bilateral lacrimal glands: Secondary | ICD-10-CM | POA: Diagnosis not present

## 2018-10-14 DIAGNOSIS — Z0001 Encounter for general adult medical examination with abnormal findings: Secondary | ICD-10-CM | POA: Diagnosis not present

## 2018-10-14 DIAGNOSIS — Z6825 Body mass index (BMI) 25.0-25.9, adult: Secondary | ICD-10-CM | POA: Diagnosis not present

## 2018-10-14 DIAGNOSIS — Z23 Encounter for immunization: Secondary | ICD-10-CM | POA: Diagnosis not present

## 2018-10-28 DIAGNOSIS — Z20828 Contact with and (suspected) exposure to other viral communicable diseases: Secondary | ICD-10-CM | POA: Diagnosis not present

## 2019-01-09 DIAGNOSIS — M48062 Spinal stenosis, lumbar region with neurogenic claudication: Secondary | ICD-10-CM | POA: Diagnosis not present

## 2019-02-24 IMAGING — MR MR LUMBAR SPINE W/O CM
4 of 5 series · 25 of 48 positions shown · non-contrast
Comparison: Lumbar spine series [DATE] 05/25/2013

CLINICAL DATA: Chronic low back pain and bilateral leg pain and
weakness.

EXAM:
MRI LUMBAR SPINE WITHOUT CONTRAST
TECHNIQUE: Multiplanar, multisequence MR imaging of the lumbar spine was
performed. No intravenous contrast was administered.

[Series 3: T2 · sagittal · 4.0mm · 0.55mm/px · 6 of 14 slices shown (1 of 2)]
[im 1/14]
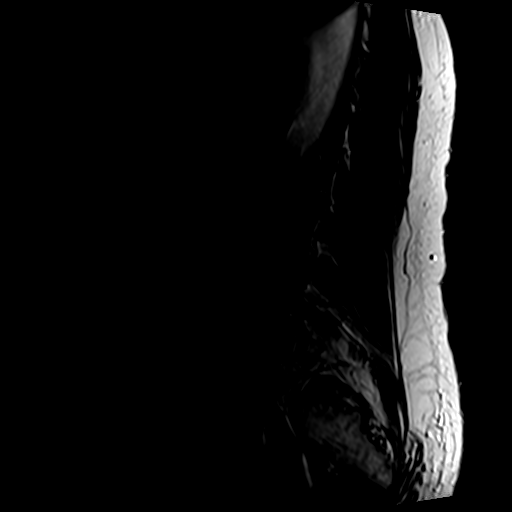
[im 3/14]
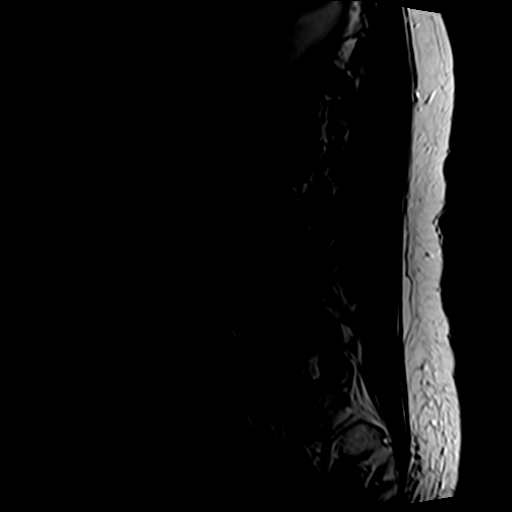
[im 6/14]
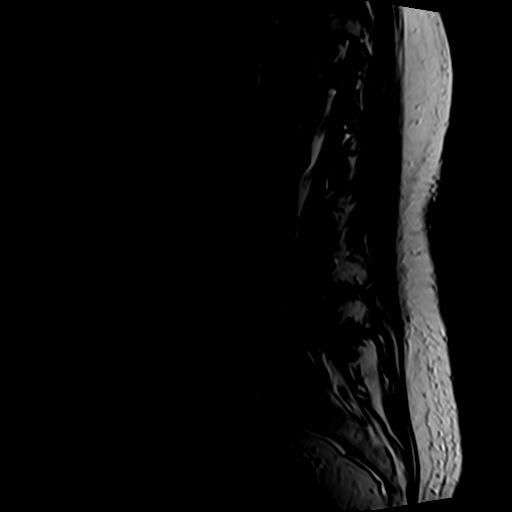
[im 8/14]
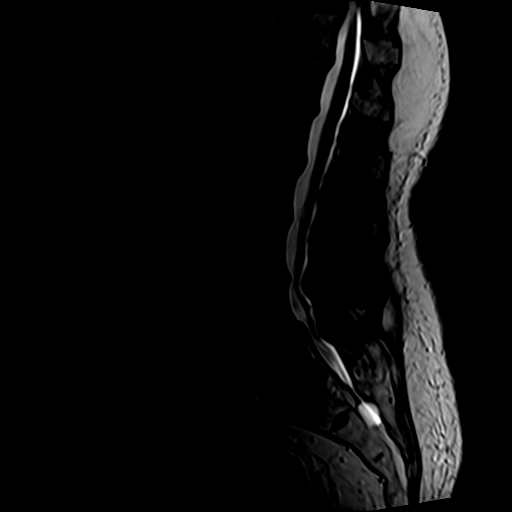
[im 11/14]
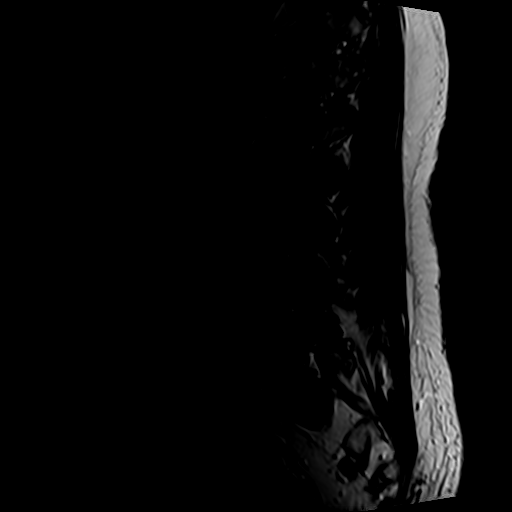
[im 14/14]
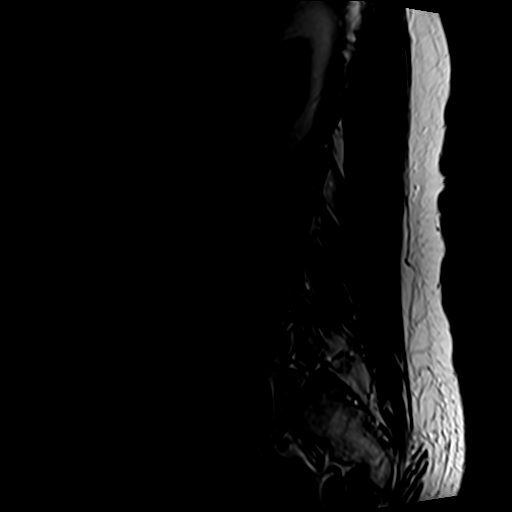

[Series 4: T1 · sagittal · 4.0mm · 0.55mm/px · 6 of 14 slices shown (1 of 2)]
[im 1/14]
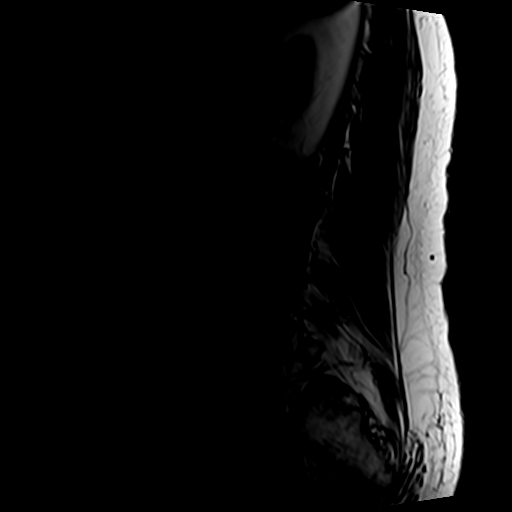
[im 3/14]
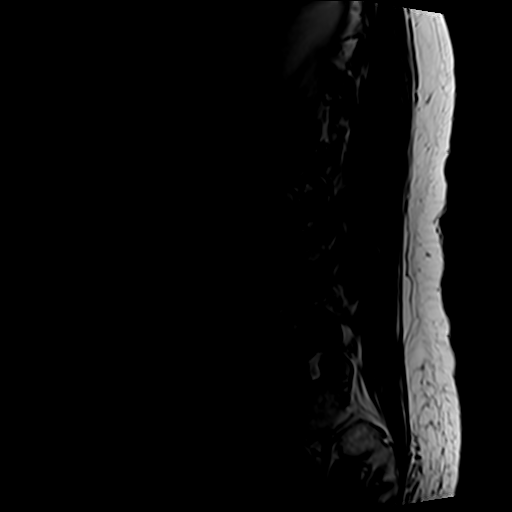
[im 6/14]
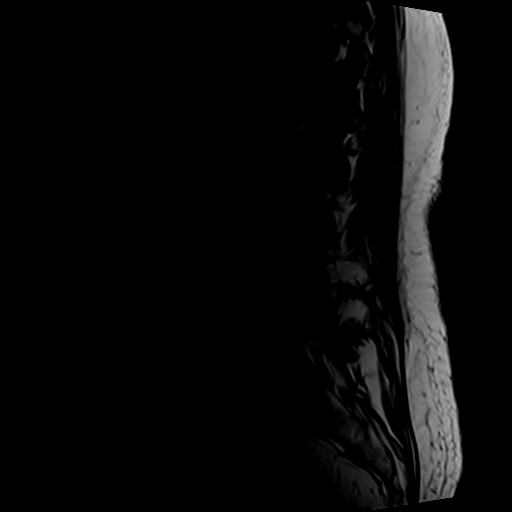
[im 8/14]
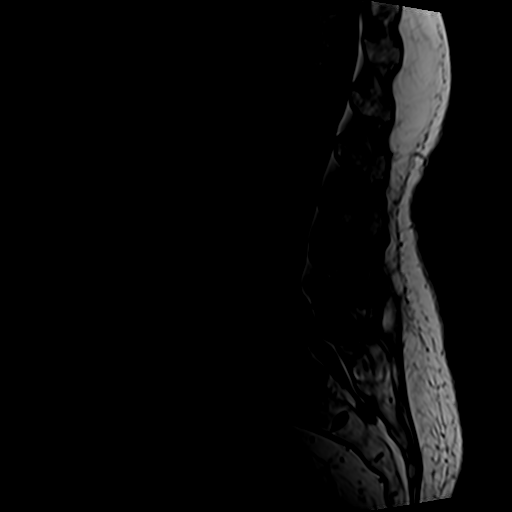
[im 11/14]
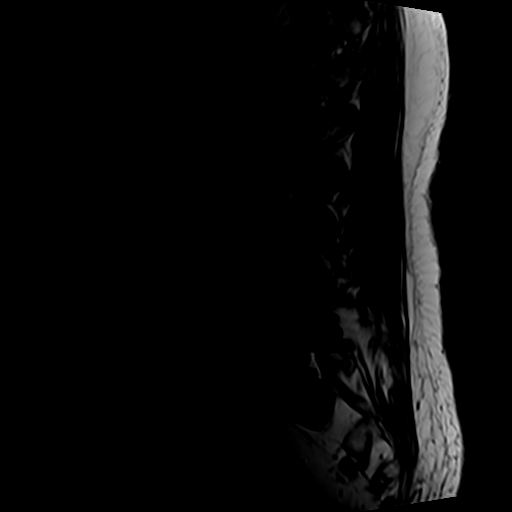
[im 14/14]
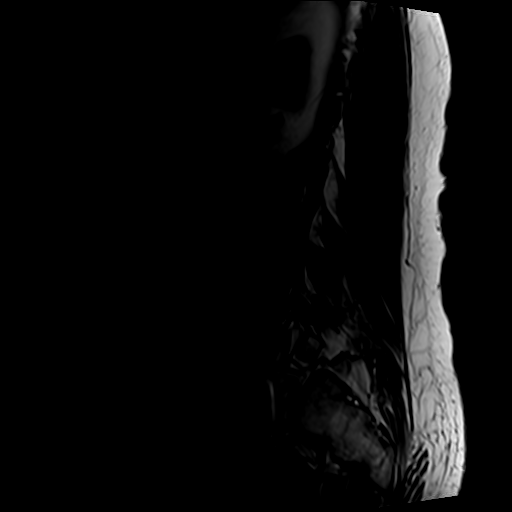

[Series 6: T2 · axial · 4.0mm · 0.70mm/px · z∈[-103,+97]mm · 9 of 37 slices shown (2 of 2)]
[im 1/37]
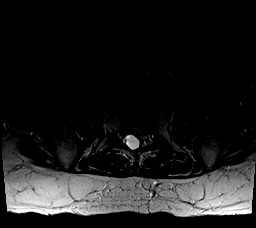
[im 6/37]
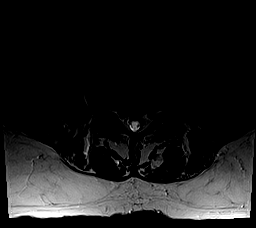
[im 11/37]
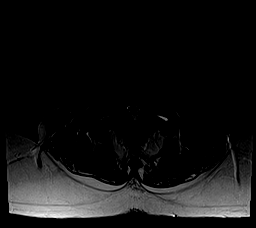
[im 16/37]
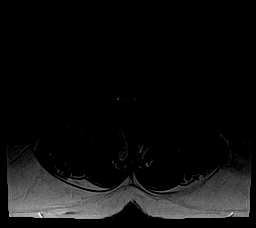
[im 19/37]
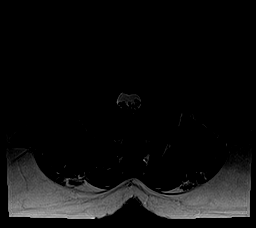
[im 21/37]
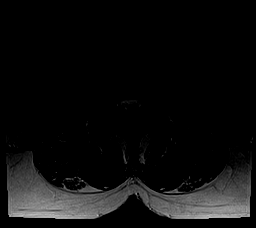
[im 26/37]
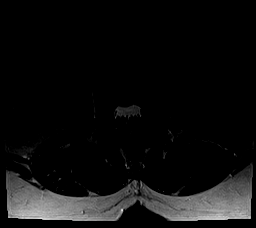
[im 31/37]
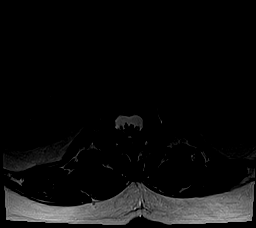
[im 37/37]
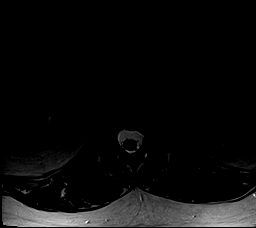

[Series 7: T1 · axial · 4.0mm · 0.35mm/px · z∈[-103,+67]mm · 4 of 37 slices shown (2 of 2)]
[im 1/37]
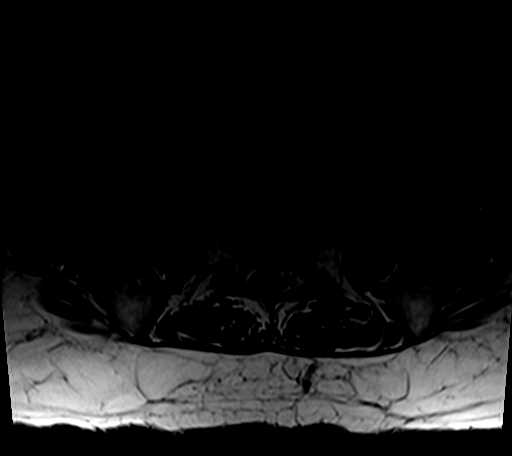
[im 6/37]
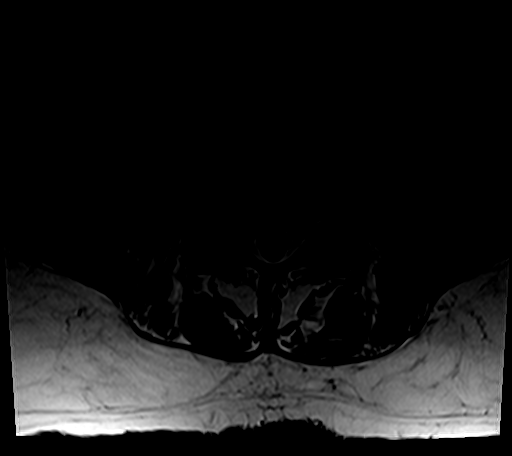
[im 19/37]
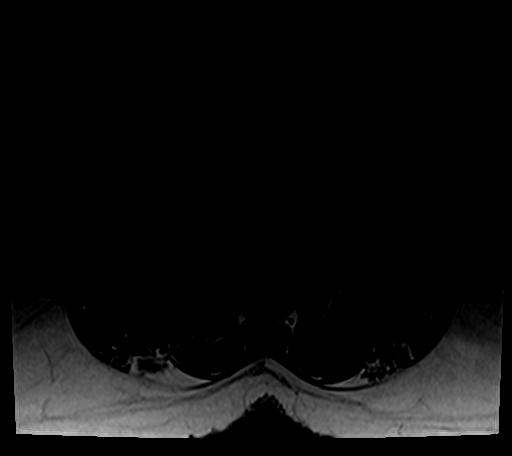
[im 31/37]
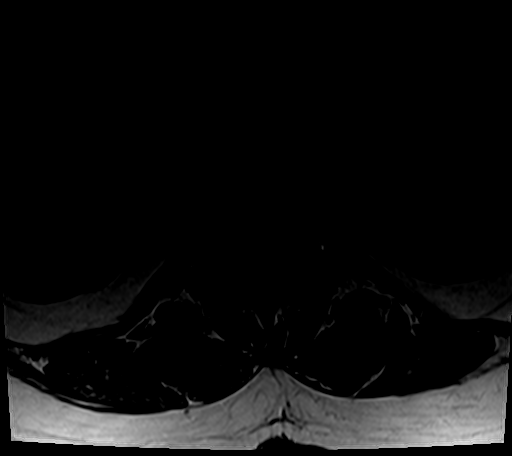

[25 of 48 positions shown; findings below may reference images not displayed]

FINDINGS: Segmentation: 5 lumbar type vertebral bodies. The last full
intervertebral disc space is labeled L5-S1. This correlates with the
lumbar radiographs and a prior CT scan.

Alignment: Degenerative anterolisthesis of L4 due to facet disease.
No definite pars defects.

Vertebrae:  Normal marrow signal.  No fracture or bone lesion.

Conus medullaris: Extends to the bottom of L1 level and appears
normal.

Paraspinal and other soft tissues: A 2 cm calculus is noted in the
right renal pelvis, unchanged since prior CT scan. No worrisome
renal lesions. The aorta is normal in caliber.

Disc levels:

T12-L1: Small right paracentral disc protrusion with minimal
impression on the thecal sac. There is also a left-sided disc
osteophyte complex with mild impression on the left side of thecal
sac. The spinal canal is generous. No spinal or foraminal stenosis.

L1-2: Mild disc disease and facet disease with slight flattening of
the ventral thecal sac but no significant spinal or foraminal
stenosis.

L2-3: Mild bulging annulus and mild facet disease with mild
bilateral lateral recess encroachment. No spinal or foraminal
stenosis.

L3-4: Bulging annulus, short pedicles and facet disease with
flattening of the ventral thecal sac and early spinal and bilateral
lateral recess stenosis. No significant foraminal stenosis.

L4-5: Bulging uncovered disc due to the degenerative anterolisthesis
of L4. Is also significant facet disease, ligamentum flavum
thickening, short pedicles and mild epidural lipomatosis
contributing to moderate to moderately severe spinal and bilateral
lateral recess stenosis. The lateral recess stenosis greater on the
left due to a shallow focal left paracentral disc protrusion. There
is also foraminal encroachment bilaterally but no significant
stenosis.

L5-S1: Advanced degenerative disc disease with a bulging annulus and
osteophytic ridging. There is a shallow central disc protrusion
without direct neural compression. Potential irritation of both S1
nerve roots. The exiting L5 nerve roots appear normal.
IMPRESSION: 1. Multilevel disc disease and facet disease.
2. The most significant level is L4-5 where there is multifactorial
moderate to mildly severe spinal and bilateral lateral recess
stenosis.
3. Mild spinal and bilateral lateral recess stenosis at L3-4.
4. Degenerate disc disease and central disc protrusion at L5-S1
without direct neural compression but potential irritation of both
S1 nerve roots.

## 2019-04-13 ENCOUNTER — Encounter: Payer: Self-pay | Admitting: Family Medicine

## 2019-04-13 ENCOUNTER — Other Ambulatory Visit: Payer: Self-pay

## 2019-04-13 ENCOUNTER — Ambulatory Visit (INDEPENDENT_AMBULATORY_CARE_PROVIDER_SITE_OTHER): Payer: Medicare HMO | Admitting: Family Medicine

## 2019-04-13 VITALS — BP 142/72 | HR 60 | Temp 97.4°F | Resp 16 | Ht 65.0 in | Wt 167.0 lb

## 2019-04-13 DIAGNOSIS — J301 Allergic rhinitis due to pollen: Secondary | ICD-10-CM

## 2019-04-13 DIAGNOSIS — E782 Mixed hyperlipidemia: Secondary | ICD-10-CM | POA: Diagnosis not present

## 2019-04-13 NOTE — Patient Instructions (Signed)
Well controlled.  No changes to medicines.  Continue to work on eating a healthy diet and exercise.  Labs drawn today.  Follow up in 10/2019 for annual medicare wellness visit with Nurse Maudie Mercury.

## 2019-04-13 NOTE — Progress Notes (Signed)
Subjective:  Patient ID: Claudia Dickerson, female    DOB: 09/01/1940  Age: 79 y.o. MRN: [id number]  Chief Complaint  Patient presents with  . Hyperlipidemia    HPI Follow up hyperlipidemia. Eats healthy and exercises. Patient is not currently on any medications. Patient has allergies and takes allegra.    Social History   Socioeconomic History  . Marital status: Married    Spouse name: Not on file  . Number of children: Not on file  . Years of education: Not on file  . Highest education level: Not on file  Occupational History  . Not on file  Tobacco Use  . Smoking status: Former Smoker    Quit date: 1981    Years since quitting: 40.2  . Smokeless tobacco: Never Used  Substance and Sexual Activity  . Alcohol use: Yes    Comment: OCCASIONAL GLASS OF WINE (ONCE A MONTH)  . Drug use: Never  . Sexual activity: Not on file  Other Topics Concern  . Not on file  Social History Narrative  . Not on file   Social Determinants of Health   Financial Resource Strain:   . Difficulty of Paying Living Expenses: Not on file  Food Insecurity:   . Worried About Charity fundraiser in the Last Year: Not on file  . Ran Out of Food in the Last Year: Not on file  Transportation Needs:   . Lack of Transportation (Medical): Not on file  . Lack of Transportation (Non-Medical): Not on file  Physical Activity:   . Days of Exercise per Week: Not on file  . Minutes of Exercise per Session: Not on file  Stress:   . Feeling of Stress : Not on file  Social Connections:   . Frequency of Communication with Friends and Family: Not on file  . Frequency of Social Gatherings with Friends and Family: Not on file  . Attends Religious Services: Not on file  . Active Member of Clubs or Organizations: Not on file  . Attends Archivist Meetings: Not on file  . Marital Status: Not on file   Past Medical History:  Diagnosis Date  . Arthritis   . Complication of anesthesia   . History of  kidney stones    IN THE LEFT KIDNEY  . PONV (postoperative nausea and vomiting)    JUST WITH 2 SURGERIES   History reviewed. No pertinent family history.  Review of Systems  Constitutional: Negative for chills, fatigue and fever.  HENT: Negative for congestion, ear pain and sore throat.   Respiratory: Negative for cough and shortness of breath.   Cardiovascular: Negative for chest pain.  Gastrointestinal: Negative for abdominal pain, constipation, diarrhea, nausea and vomiting.  Endocrine: Negative for polydipsia, polyphagia and polyuria.  Genitourinary: Negative for dysuria and urgency.  Musculoskeletal: Negative for arthralgias and myalgias.  Neurological: Negative for dizziness and headaches.  Psychiatric/Behavioral: Negative for dysphoric mood. The patient is not nervous/anxious.      Objective:  BP (!) 142/72   Pulse 60   Temp (!) 97.4 F (36.3 C)   Resp 16   Ht 5\' 5"  (1.651 m)   Wt 167 lb (75.8 kg)   BMI 27.79 kg/m   BP/Weight 04/13/2019 04/03/2018 [id number]  Systolic BP [id number] 0000000 -  Diastolic BP 72 58 -  Wt. (Lbs) 167 - 145  BMI 27.79 - 24.13    Physical Exam Vitals reviewed.  Constitutional:      Appearance: Normal appearance. She  is normal weight.  Neck:     Vascular: No carotid bruit.  Cardiovascular:     Rate and Rhythm: Normal rate and regular rhythm.     Pulses: Normal pulses.     Heart sounds: Normal heart sounds.  Pulmonary:     Effort: Pulmonary effort is normal. No respiratory distress.     Breath sounds: Normal breath sounds.  Abdominal:     General: Abdomen is flat. Bowel sounds are normal.     Palpations: Abdomen is soft.     Tenderness: There is no abdominal tenderness.  Neurological:     Mental Status: She is alert and oriented to person, place, and time.  Psychiatric:        Mood and Affect: Mood normal.        Behavior: Behavior normal.     Lab Results  Component Value Date   WBC 17.2 (H) 04/01/2018   HGB 11.7 (L) 04/01/2018   HCT  37.6 04/01/2018   PLT 225 04/01/2018   GLUCOSE 140 (H) 04/01/2018   NA 138 04/01/2018   K 4.5 04/01/2018   CL 103 04/01/2018   CREATININE 0.92 04/01/2018   BUN 14 04/01/2018   CO2 25 04/01/2018      Assessment & Plan:  1. Mixed hyperlipidemia Well controlled.  Continue to work on eating a healthy diet and exercise.  Labs drawn today.  - CBC with Differential/Platelet - Lipid panel - Comp. Metabolic Panel (12)   2. Allergic rhinitis - continue allegra.  Follow-up: Return in about 6 months (around 10/14/2019) for Annual Medicare Wellness Visit with Nurse Maudie Mercury. Follow up in 10/2019 for annual medicare wellness visit with Nurse Maudie Mercury. AVS was given to patient prior to departure.  Rochel Brome [facility] [phone]

## 2019-04-14 DIAGNOSIS — Z1231 Encounter for screening mammogram for malignant neoplasm of breast: Secondary | ICD-10-CM | POA: Diagnosis not present

## 2019-04-15 LAB — COMP. METABOLIC PANEL (12)
AST: 23 IU/L (ref 0–40)
Albumin/Globulin Ratio: 1.9 (ref 1.2–2.2)
Albumin: 4.2 g/dL (ref 3.7–4.7)
Alkaline Phosphatase: 116 IU/L (ref 39–117)
BUN/Creatinine Ratio: 16 (ref 12–28)
BUN: 14 mg/dL (ref 8–27)
Bilirubin Total: 0.3 mg/dL (ref 0.0–1.2)
Calcium: 9.2 mg/dL (ref 8.7–10.3)
Chloride: 106 mmol/L (ref 96–106)
Creatinine, Ser: 0.85 mg/dL (ref 0.57–1.00)
GFR calc Af Amer: 76 mL/min/{1.73_m2} (ref >59)
GFR calc non Af Amer: 66 mL/min/{1.73_m2} (ref >59)
Globulin, Total: 2.2 g/dL (ref 1.5–4.5)
Glucose: 90 mg/dL (ref 65–99)
Potassium: 4.4 mmol/L (ref 3.5–5.2)
Sodium: 143 mmol/L (ref 134–144)
Total Protein: 6.4 g/dL (ref 6.0–8.5)

## 2019-04-15 LAB — LIPID PANEL
Chol/HDL Ratio: 4.3 ratio (ref 0.0–4.4)
Cholesterol, Total: 200 mg/dL — ABNORMAL HIGH (ref 100–199)
HDL: 47 mg/dL (ref >39)
LDL Chol Calc (NIH): 129 mg/dL — ABNORMAL HIGH (ref 0–99)
Triglycerides: 133 mg/dL (ref 0–149)
VLDL Cholesterol Cal: 24 mg/dL (ref 5–40)

## 2019-04-15 LAB — CBC WITH DIFFERENTIAL/PLATELET
Basophils Absolute: 0 10*3/uL (ref 0.0–0.2)
Basos: 0 %
EOS (ABSOLUTE): 0.2 10*3/uL (ref 0.0–0.4)
Eos: 3 %
Hematocrit: 39.8 % (ref 34.0–46.6)
Hemoglobin: 13.6 g/dL (ref 11.1–15.9)
Immature Grans (Abs): 0 10*3/uL (ref 0.0–0.1)
Immature Granulocytes: 0 %
Lymphocytes Absolute: 2.1 10*3/uL (ref 0.7–3.1)
Lymphs: 34 %
MCH: 29 pg (ref 26.6–33.0)
MCHC: 34.2 g/dL (ref 31.5–35.7)
MCV: 85 fL (ref 79–97)
Monocytes Absolute: 0.6 10*3/uL (ref 0.1–0.9)
Monocytes: 10 %
Neutrophils Absolute: 3.3 10*3/uL (ref 1.4–7.0)
Neutrophils: 53 %
Platelets: 208 10*3/uL (ref 150–450)
RBC: 4.69 x10E6/uL (ref 3.77–5.28)
RDW: 13.3 % (ref 11.7–15.4)
WBC: 6.2 10*3/uL (ref 3.4–10.8)

## 2019-04-15 LAB — CARDIOVASCULAR RISK ASSESSMENT

## 2019-04-23 ENCOUNTER — Encounter: Payer: Self-pay | Admitting: Family Medicine

## 2019-05-02 DIAGNOSIS — J301 Allergic rhinitis due to pollen: Secondary | ICD-10-CM | POA: Insufficient documentation

## 2019-07-13 ENCOUNTER — Other Ambulatory Visit: Payer: Self-pay

## 2019-07-13 MED ORDER — DIAZEPAM 5 MG PO TABS: 5.0000 mg | ORAL_TABLET | Freq: Every evening | ORAL | 0 refills | Status: DC | PRN

## 2019-08-19 DIAGNOSIS — H04123 Dry eye syndrome of bilateral lacrimal glands: Secondary | ICD-10-CM | POA: Diagnosis not present

## 2019-10-05 ENCOUNTER — Telehealth (INDEPENDENT_AMBULATORY_CARE_PROVIDER_SITE_OTHER): Payer: Medicare HMO | Admitting: Family Medicine

## 2019-10-05 VITALS — Ht 65.0 in | Wt 158.0 lb

## 2019-10-05 DIAGNOSIS — R319 Hematuria, unspecified: Secondary | ICD-10-CM | POA: Diagnosis not present

## 2019-10-05 LAB — POCT URINALYSIS DIPSTICK
Bilirubin, UA: NEGATIVE
Blood, UA: POSITIVE
Glucose, UA: NEGATIVE
Ketones, UA: NEGATIVE
Nitrite, UA: NEGATIVE
Protein, UA: POSITIVE — AB
Spec Grav, UA: 1.02 (ref 1.010–1.025)
Urobilinogen, UA: 0.2 E.U./dL
pH, UA: 7 (ref 5.0–8.0)

## 2019-10-05 MED ORDER — NITROFURANTOIN MONOHYD MACRO 100 MG PO CAPS: 100.0000 mg | ORAL_CAPSULE | Freq: Two times a day (BID) | ORAL | 0 refills | Status: DC

## 2019-10-05 NOTE — Progress Notes (Signed)
Virtual Visit via Telephone Note  Pt at work-request appt to treat due to need to work today Provider in clinic  PCP:  Rochel Brome, MD   Evaluation Performed: pt request urinalysis  Chief Complaint:  Blood noted in urine, nocturia  Presents for UTI symptoms started Friday night has frequency to urinate  but denies any discomfort, fever, chills, abdominal pain or back pain.   History of Present Illness:    Claudia Dickerson is a 79 y.o. female with nocturia, specks in urine Friday night -difficulty sleeping and nocturia Back surgery -no increase in back pain Itching-no dysuria Sat-specks of blood noted in urine Kidney stones-noted in past-large stone(32mm-11/19-removed ) at [facility] smoking many years ago No pain with urination  Past Medical History:  Diagnosis Date  . Arthritis   . Complication of anesthesia   . History of kidney stones    IN THE LEFT KIDNEY  . PONV (postoperative nausea and vomiting)    JUST WITH 2 SURGERIES    Past Surgical History:  Procedure Laterality Date  . ABDOMINAL HYSTERECTOMY    . [place] IN 2005  . EYE SURGERY     BIL CATARACTS  . LAMINECTOMY  03/31/2018    No family history on file.  Social History   Socioeconomic History  . Marital status: Married    Spouse name: Not on file  . Number of children: Not on file  . Years of education: Not on file  . Highest education level: Not on file  Occupational History  . Not on file  Tobacco Use  . Smoking status: Former Smoker    Quit date: 1981    Years since quitting: 40.6  . Smokeless tobacco: Never Used  Substance and Sexual Activity  . Alcohol use: Yes    Comment: OCCASIONAL GLASS OF WINE (ONCE A MONTH)  . Drug use: Never  . Sexual activity: Not on file  Other Topics Concern  . Not on file  Social History Narrative  . Not on file   Social Determinants of Health   Financial Resource Strain:   . Difficulty of Paying Living Expenses: Not on file  Food  Insecurity:   . Worried About Charity fundraiser in the Last Year: Not on file  . Ran Out of Food in the Last Year: Not on file  Transportation Needs:   . Lack of Transportation (Medical): Not on file  . Lack of Transportation (Non-Medical): Not on file  Physical Activity:   . Days of Exercise per Week: Not on file  . Minutes of Exercise per Session: Not on file  Stress:   . Feeling of Stress : Not on file  Social Connections:   . Frequency of Communication with Friends and Family: Not on file  . Frequency of Social Gatherings with Friends and Family: Not on file  . Attends Religious Services: Not on file  . Active Member of Clubs or Organizations: Not on file  . Attends Archivist Meetings: Not on file  . Marital Status: Not on file  Intimate Partner Violence:   . Fear of Current or Ex-Partner: Not on file  . Emotionally Abused: Not on file  . Physically Abused: Not on file  . Sexually Abused: Not on file    Outpatient Medications Prior to Visit  Medication Sig Dispense Refill  . acetaminophen (TYLENOL) 500 MG tablet Take 1,000 mg by mouth every 6 (six) hours as needed for moderate  pain.    . b complex vitamins tablet Take 1 tablet by mouth daily.    . Calcium Carb-Cholecalciferol (CALCIUM 600 + D PO) Take 1 tablet by mouth daily.    . Cholecalciferol (VITAMIN D) 50 MCG (2000 UT) CAPS Take 2,000 Units by mouth daily.    . diazepam (VALIUM) 5 MG tablet Take 1 tablet (5 mg total) by mouth at bedtime as needed for sedation. 30 tablet 0  . fexofenadine (ALLEGRA) 180 MG tablet Take 180 mg by mouth daily.    . magnesium oxide (MAG-OX) 400 MG tablet Take 400 mg by mouth daily.    . methocarbamol (ROBAXIN) 500 MG tablet Take 1 tablet (500 mg total) by mouth every 6 (six) hours as needed for muscle spasms. 40 tablet 3  . Potassium 99 MG TABS Take 99 mg by mouth daily.    . Potassium Gluconate 2.5 MEQ TABS Take by mouth.    . Probiotic Product (PROBIOTIC DAILY PO) Take 1 tablet  by mouth daily.    . traMADol (ULTRAM) 50 MG tablet Take 1-2 tablets (50-100 mg total) by mouth every 6 (six) hours as needed for moderate pain or severe pain. 60 tablet 1   No facility-administered medications prior to visit.   .med Allergies:   Amoxicillin and Xarelto [rivaroxaban]   Social History   Tobacco Use  . Smoking status: Former Smoker    Quit date: 1981    Years since quitting: 40.6  . Smokeless tobacco: Never Used  Substance Use Topics  . Alcohol use: Yes    Comment: OCCASIONAL GLASS OF WINE (ONCE A MONTH)  . Drug use: Never     Review of Systems  Constitutional: Negative for chills, fever and malaise/fatigue.  HENT: Negative for ear pain, sinus pain and sore throat.   Respiratory: Negative for cough and shortness of breath.   Cardiovascular: Negative for chest pain.  Genitourinary: Positive for hematuria and urgency.  Musculoskeletal: Positive for back pain (From surgery). Negative for myalgias.  Neurological: Negative for headaches.     Labs/Other Tests and Data Reviewed:   U/a + large blood, +protein Recent Labs: 04/13/2019: BUN 14; Creatinine, Ser 0.85; Hemoglobin 13.6; Platelets 208; Potassium 4.4; Sodium 143   Recent Lipid Panel Lab Results  Component Value Date/Time   CHOL 200 (H) 04/13/2019 10:42 AM   TRIG 133 04/13/2019 10:42 AM   HDL 47 04/13/2019 10:42 AM   CHOLHDL 4.3 04/13/2019 10:42 AM   LDLCALC 129 (H) 04/13/2019 10:42 AM    Wt Readings from Last 3 Encounters:  04/13/19 167 lb (75.8 kg)  03/31/18 145 lb (65.8 kg)     Objective:    Today's Vitals   10/05/19 0857  Weight: 158 lb (71.7 kg)  Height: 5\' 5"  (1.651 m)   Body mass index is 26.29 kg/m. Vital Signs:   125/70 -average bp at home HR normal at home Physical Exam none  ASSESSMENT & PLAN:     1. Hematuria, unspecified type - Urine Culture - POCT urinalysis dipstick Increase water Time:   Today, I have spent 9 minutes with the patient with telephone technology  discussing the above problems.    Follow Up: we will call with results of the urine culture Benny Lennert MD

## 2019-10-08 LAB — URINE CULTURE

## 2020-01-18 ENCOUNTER — Encounter: Payer: Medicare HMO | Admitting: Family Medicine

## 2020-01-20 NOTE — Progress Notes (Signed)
This encounter was created in error - please disregard.

## 2020-02-03 ENCOUNTER — Telehealth: Payer: Self-pay

## 2020-02-03 NOTE — Telephone Encounter (Signed)
Patient left a message requesting that a medication be sent to the pharmacy for a sinus infx. Called and left a message advising patient to call our office back to schedule a appointment.

## 2020-02-04 ENCOUNTER — Encounter: Payer: Self-pay | Admitting: Nurse Practitioner

## 2020-02-04 ENCOUNTER — Telehealth (INDEPENDENT_AMBULATORY_CARE_PROVIDER_SITE_OTHER): Payer: Medicare HMO | Admitting: Nurse Practitioner

## 2020-02-04 VITALS — BP 143/72 | Temp 97.4°F | Ht 65.0 in | Wt 155.0 lb

## 2020-02-04 DIAGNOSIS — J310 Chronic rhinitis: Secondary | ICD-10-CM | POA: Diagnosis not present

## 2020-02-04 DIAGNOSIS — J329 Chronic sinusitis, unspecified: Secondary | ICD-10-CM

## 2020-02-04 MED ORDER — AZITHROMYCIN 250 MG PO TABS: ORAL_TABLET | ORAL | 0 refills | Status: DC

## 2020-02-04 MED ORDER — FLUTICASONE PROPIONATE 50 MCG/ACT NA SUSP: 2.0000 | Freq: Every day | NASAL | 6 refills | Status: DC

## 2020-02-04 NOTE — Progress Notes (Addendum)
Acute Office Visit  Subjective:    Patient ID: Claudia Dickerson, female    DOB: [birth year], 79 y.o.   MRN: [id number]  Chief Complaint  Patient presents with  . Sinusitis    Cough, congestion, green drainage.   This visit type was conducted due to national recommendations for restrictions regarding the COVID-19 Pandemic (e.g. social distancing) in an effort to limit this patient's exposure and mitigate transmission in our community.  Due to her co-morbid illnesses, this patient is at least at moderate risk for complications without adequate follow up.  This format is felt to be most appropriate for this patient at this time.  The patient did not have access to video technology/had technical difficulties with video requiring transitioning to audio format only (telephone).  All issues noted in this document were discussed and addressed.  No physical exam could be performed with this format.  Patient verbally consented to a telehealth visit.   Patient Location: Home Provider Location: Office/Clinic  PCP:  Rochel Brome, MD            Evaluation Performed:  Established patient, acute telemedicine visit  Chief Complaint: Sinus congestion   HPI  Claudia Dickerson is a 79 year old female that has sinus congestion, post-nasal-drip, sore throat, cough, and chest congestion. Onset of symptoms was 2-weeks ago. Treatments include Mucinex Sinus-Max, Robitussin, and warm salt water gargles. She has received COVID-19 vaccinations. Denies known ill-contacts.   Past Medical History:  Diagnosis Date  . Arthritis   . Complication of anesthesia   . History of kidney stones    IN THE LEFT KIDNEY  . PONV (postoperative nausea and vomiting)    JUST WITH 2 SURGERIES    Past Surgical History:  Procedure Laterality Date  . ABDOMINAL HYSTERECTOMY    . [place] IN 2005  . EYE SURGERY     BIL CATARACTS  . LAMINECTOMY  03/31/2018    History reviewed. No pertinent family history.  Social  History   Socioeconomic History  . Marital status: Married    Spouse name: Not on file  . Number of children: Not on file  . Years of education: Not on file  . Highest education level: Not on file  Occupational History  . Not on file  Tobacco Use  . Smoking status: Former Smoker    Quit date: 1981    Years since quitting: 41.0  . Smokeless tobacco: Never Used  Substance and Sexual Activity  . Alcohol use: Yes    Comment: OCCASIONAL GLASS OF WINE (ONCE A MONTH)  . Drug use: Never  . Sexual activity: Not on file  Other Topics Concern  . Not on file  Social History Narrative  . Not on file   Social Determinants of Health   Financial Resource Strain: Not on file  Food Insecurity: Not on file  Transportation Needs: Not on file  Physical Activity: Not on file  Stress: Not on file  Social Connections: Not on file  Intimate Partner Violence: Not on file    Outpatient Medications Prior to Visit  Medication Sig Dispense Refill  . b complex vitamins tablet Take 1 tablet by mouth daily.    . Calcium Carb-Cholecalciferol (CALCIUM 600 + D PO) Take 1 tablet by mouth daily.    . Cholecalciferol (VITAMIN D) 50 MCG (2000 UT) CAPS Take 2,000 Units by mouth daily.    . diazepam (VALIUM) 5 MG tablet Take 1 tablet (5 mg total) by mouth at  bedtime as needed for sedation. 30 tablet 0  . fexofenadine (ALLEGRA) 180 MG tablet Take 180 mg by mouth daily.    . IBUPROFEN & CAFFEINE-VITAMINS PO Take by mouth.    . magnesium oxide (MAG-OX) 400 MG tablet Take 400 mg by mouth daily.    . Potassium 99 MG TABS Take 99 mg by mouth daily.    . Potassium Gluconate 2.5 MEQ TABS Take by mouth.    . Probiotic Product (PROBIOTIC DAILY PO) Take 1 tablet by mouth daily.    . traMADol (ULTRAM) 50 MG tablet Take 1-2 tablets (50-100 mg total) by mouth every 6 (six) hours as needed for moderate pain or severe pain. 60 tablet 1  . acetaminophen (TYLENOL) 500 MG tablet Take 1,000 mg by mouth every 6 (six) hours as  needed for moderate pain.    . nitrofurantoin, macrocrystal-monohydrate, (MACROBID) 100 MG capsule Take 1 capsule (100 mg total) by mouth 2 (two) times daily. 10 capsule 0   No facility-administered medications prior to visit.    Allergies  Allergen Reactions  . Amoxicillin Swelling    Did it involve swelling of the face/tongue/throat, SOB, or low BP? Yes Did it involve sudden or severe rash/hives, skin peeling, or any reaction on the inside of your mouth or nose? No Did you need to seek medical attention at a hospital or doctor's office? No When did it last happen?July 2013 If all above answers are "NO", may proceed with cephalosporin use.    Carlena Hurl [Rivaroxaban]     Review of Systems  Constitutional: Negative for fatigue and fever.  HENT: Positive for congestion, sinus pressure, sinus pain and sore throat. Negative for ear pain.   Respiratory: Positive for cough. Negative for shortness of breath.   Cardiovascular: Negative for chest pain.  Gastrointestinal: Negative for abdominal pain, constipation, diarrhea, nausea and vomiting.  Genitourinary: Negative for dysuria, frequency and urgency.  Musculoskeletal: Negative for arthralgias, back pain and myalgias.  Skin: Negative.   Neurological: Positive for headaches. Negative for dizziness and weakness.  Psychiatric/Behavioral: Negative for agitation and sleep disturbance. The patient is not nervous/anxious.        Objective:     No physical exam performed due to telemedicine visit  Physical Exam Vitals reviewed.   BP (!) 143/72   Temp (!) 97.4 F (36.3 C)   Ht 5\' 5"  (1.651 m)   Wt 155 lb (70.3 kg)   BMI 25.79 kg/m   No physical exam performed due to telemedicine exam. BP (!) 143/72   Temp (!) 97.4 F (36.3 C)   Ht 5\' 5"  (1.651 m)   Wt 155 lb (70.3 kg)   BMI 25.79 kg/m  Wt Readings from Last 3 Encounters:  02/04/20 155 lb (70.3 kg)  10/05/19 158 lb (71.7 kg)  04/13/19 167 lb (75.8 kg)    Health  Maintenance Due  Topic Date Due  . Hepatitis C Screening  Never done  . TETANUS/TDAP  Never done  . INFLUENZA VACCINE  Never done  . PNA vac Low Risk Adult (2 of 2 - PCV13) 10/14/2019  . COVID-19 Vaccine (3 - Booster for Pfizer series) 10/28/2019       No results found for: TSH Lab Results  Component Value Date   WBC 6.2 04/13/2019   HGB 13.6 04/13/2019   HCT 39.8 04/13/2019   MCV 85 04/13/2019   PLT 208 04/13/2019   Lab Results  Component Value Date   NA 143 04/13/2019   K 4.4  04/13/2019   CO2 25 04/01/2018   GLUCOSE 90 04/13/2019   BUN 14 04/13/2019   CREATININE 0.85 04/13/2019   BILITOT 0.3 04/13/2019   ALKPHOS 116 04/13/2019   AST 23 04/13/2019   PROT 6.4 04/13/2019   ALBUMIN 4.2 04/13/2019   CALCIUM 9.2 04/13/2019   ANIONGAP 10 04/01/2018   Lab Results  Component Value Date   CHOL 200 (H) 04/13/2019   Lab Results  Component Value Date   HDL 47 04/13/2019   Lab Results  Component Value Date   LDLCALC 129 (H) 04/13/2019   Lab Results  Component Value Date   TRIG 133 04/13/2019   Lab Results  Component Value Date   CHOLHDL 4.3 04/13/2019        Assessment & Plan:   1. Rhinosinusitis - azithromycin (ZITHROMAX) 250 MG tablet; Take two tablets by mouth on day one, take one tablet by mouth day two-five  Dispense: 6 tablet; Refill: 0 - fluticasone (FLONASE) 50 MCG/ACT nasal spray; Place 2 sprays into both nostrils daily.  Dispense: 16 g; Refill: 6  Telephone time: 08:11-08:19  I spent 15 minutes dedicated to the care of this patient on the date of this encounter to include telephone time with the patient, as well as:EMR review and prescription drug management.   Follow-up: PRN if symptoms worsen or fail to improve  An After Visit Summary was printed and given to the patient.  Rip Harbour, NP [place] [phone]

## 2020-06-21 ENCOUNTER — Other Ambulatory Visit: Payer: Self-pay

## 2020-06-21 MED ORDER — DIAZEPAM 5 MG PO TABS: 5.0000 mg | ORAL_TABLET | Freq: Every evening | ORAL | 0 refills | Status: DC | PRN

## 2020-06-21 NOTE — Telephone Encounter (Signed)
Made an appointment for 6/6 for fasting fu. Last chronic visit 3/21.   Royce Macadamia, [place] 06/21/20 8:57 AM

## 2020-07-11 ENCOUNTER — Other Ambulatory Visit: Payer: Self-pay

## 2020-07-11 ENCOUNTER — Ambulatory Visit (INDEPENDENT_AMBULATORY_CARE_PROVIDER_SITE_OTHER): Payer: Medicare HMO | Admitting: Family Medicine

## 2020-07-11 ENCOUNTER — Encounter: Payer: Self-pay | Admitting: Family Medicine

## 2020-07-11 VITALS — BP 140/70 | HR 72 | Temp 96.4°F | Resp 18 | Ht 65.0 in | Wt 164.6 lb

## 2020-07-11 DIAGNOSIS — Z1211 Encounter for screening for malignant neoplasm of colon: Secondary | ICD-10-CM | POA: Diagnosis not present

## 2020-07-11 DIAGNOSIS — Z13820 Encounter for screening for osteoporosis: Secondary | ICD-10-CM

## 2020-07-11 DIAGNOSIS — Z23 Encounter for immunization: Secondary | ICD-10-CM

## 2020-07-11 DIAGNOSIS — Z0000 Encounter for general adult medical examination without abnormal findings: Secondary | ICD-10-CM

## 2020-07-11 DIAGNOSIS — E782 Mixed hyperlipidemia: Secondary | ICD-10-CM | POA: Diagnosis not present

## 2020-07-11 DIAGNOSIS — Z1231 Encounter for screening mammogram for malignant neoplasm of breast: Secondary | ICD-10-CM | POA: Diagnosis not present

## 2020-07-11 MED ORDER — TRAMADOL HCL 50 MG PO TABS: 50.0000 mg | ORAL_TABLET | Freq: Four times a day (QID) | ORAL | 1 refills | Status: DC | PRN

## 2020-07-11 NOTE — Patient Instructions (Signed)
Recommend tdap (tetanus) and Shingles vaccines at pharmacy.

## 2020-07-11 NOTE — Progress Notes (Signed)
Subjective:  Patient ID: Claudia Dickerson, female    DOB: 06-08-1940  Age: 80 y.o. MRN: [id number]  Chief Complaint  Patient presents with  . Hyperlipidemia   HPI Hyperlipidemia: Does not eat healthy. Eats out. Very active.  Adult nurse.  Back pain: had surgery in 03/2018. Uses tramadol sparingly. Once a week. Tries ibuprofen and tylenol some every day.   Patient tries to eat healthy. She exercises some.   Functional Status Survey: Is the patient deaf or have difficulty hearing?: No Does the patient have difficulty seeing, even when wearing glasses/contacts?: No Does the patient have difficulty concentrating, remembering, or making decisions?: No Does the patient have difficulty walking or climbing stairs?: No Does the patient have difficulty dressing or bathing?: No Does the patient have difficulty doing errands alone such as visiting a doctor's office or shopping?: No    Current Outpatient Medications on File Prior to Visit  Medication Sig Dispense Refill  . b complex vitamins tablet Take 1 tablet by mouth daily.    . Calcium Carb-Cholecalciferol (CALCIUM 600 + D PO) Take 1 tablet by mouth daily.    . Cholecalciferol (VITAMIN D) 50 MCG (2000 UT) CAPS Take 2,000 Units by mouth daily.    . diazepam (VALIUM) 5 MG tablet Take 1 tablet (5 mg total) by mouth at bedtime as needed for sedation. 30 tablet 0  . fexofenadine (ALLEGRA) 180 MG tablet Take 180 mg by mouth daily.    . fluticasone (FLONASE) 50 MCG/ACT nasal spray Place 2 sprays into both nostrils daily. 16 g 6  . IBUPROFEN & CAFFEINE-VITAMINS PO Take by mouth.    . magnesium oxide (MAG-OX) 400 MG tablet Take 400 mg by mouth daily.    . Potassium 99 MG TABS Take 99 mg by mouth daily.    . Probiotic Product (PROBIOTIC DAILY PO) Take 1 tablet by mouth daily.     No current facility-administered medications on file prior to visit.   Past Medical History:  Diagnosis Date  . Arthritis   . Complication of anesthesia    . History of kidney stones    IN THE LEFT KIDNEY  . PONV (postoperative nausea and vomiting)    JUST WITH 2 SURGERIES   Past Surgical History:  Procedure Laterality Date  . ABDOMINAL HYSTERECTOMY    . [place] IN 2005  . EYE SURGERY     BIL CATARACTS  . LAMINECTOMY  03/31/2018    No family history on file. Social History   Socioeconomic History  . Marital status: Married    Spouse name: Not on file  . Number of children: Not on file  . Years of education: Not on file  . Highest education level: Not on file  Occupational History  . Not on file  Tobacco Use  . Smoking status: Former Smoker    Quit date: 1981    Years since quitting: 41.4  . Smokeless tobacco: Never Used  Substance and Sexual Activity  . Alcohol use: Yes    Comment: OCCASIONAL GLASS OF WINE (ONCE A MONTH)  . Drug use: Never  . Sexual activity: Not on file  Other Topics Concern  . Not on file  Social History Narrative  . Not on file   Social Determinants of Health   Financial Resource Strain: Not on file  Food Insecurity: Not on file  Transportation Needs: Not on file  Physical Activity: Not on file  Stress: Not on file  Social  Connections: Not on file    Review of Systems  Constitutional: Negative for chills, fatigue and fever.  HENT: Negative for congestion, rhinorrhea and sore throat.   Respiratory: Negative for cough and shortness of breath.   Cardiovascular: Negative for chest pain.  Gastrointestinal: Negative for abdominal pain, constipation, diarrhea, nausea and vomiting.  Genitourinary: Negative for dysuria and urgency.  Musculoskeletal: Positive for back pain. Negative for myalgias.  Neurological: Negative for dizziness, weakness, light-headedness and headaches.  Psychiatric/Behavioral: Negative for dysphoric mood. The patient is not nervous/anxious.      Objective:  BP 140/70   Pulse 72   Temp (!) 96.4 F (35.8 C)   Resp 18   Ht 5\' 5"  (1.651 m)   Wt 164  lb 9.6 oz (74.7 kg)   BMI 27.39 kg/m   BP/Weight 07/11/2020 02/04/2020 0/63/0160  Systolic BP 109 323 -  Diastolic BP 70 72 -  Wt. (Lbs) 164.6 155 158  BMI 27.39 25.79 26.29    Physical Exam Vitals reviewed.  Constitutional:      General: She is not in acute distress.    Appearance: Normal appearance. She is obese.  HENT:     Right Ear: Tympanic membrane and ear canal normal.     Left Ear: Tympanic membrane and ear canal normal.     Nose: No congestion or rhinorrhea.  Neck:     Thyroid: No thyroid mass.     Vascular: No carotid bruit.  Cardiovascular:     Rate and Rhythm: Normal rate and regular rhythm.     Pulses: Normal pulses.     Heart sounds: Normal heart sounds. No murmur heard.   Pulmonary:     Effort: Pulmonary effort is normal.     Breath sounds: Normal breath sounds.  Abdominal:     General: Bowel sounds are normal.     Palpations: Abdomen is soft. There is no mass.     Tenderness: There is no abdominal tenderness.  Neurological:     Mental Status: She is alert and oriented to person, place, and time.     Cranial Nerves: No cranial nerve deficit.  Psychiatric:        Mood and Affect: Mood normal.        Behavior: Behavior normal.     Diabetic Foot Exam - Simple   No data filed      Lab Results  Component Value Date   WBC 6.2 04/13/2019   HGB 13.6 04/13/2019   HCT 39.8 04/13/2019   PLT 208 04/13/2019   GLUCOSE 90 04/13/2019   CHOL 200 (H) 04/13/2019   TRIG 133 04/13/2019   HDL 47 04/13/2019   LDLCALC 129 (H) 04/13/2019   AST 23 04/13/2019   NA 143 04/13/2019   K 4.4 04/13/2019   CL 106 04/13/2019   CREATININE 0.85 04/13/2019   BUN 14 04/13/2019   CO2 25 04/01/2018      Assessment & Plan:   1. Mixed hyperlipidemia Well controlled.  No changes to medicines.  Continue to work on eating a healthy diet and exercise.  Labs drawn today.  - CBC with Differential/Platelet - Comprehensive metabolic panel - Lipid panel - TSH  2.  Encounter for screening for osteoporosis - DG Bone Density  3. Visit for screening mammogram - MM DIGITAL SCREENING BILATERAL  4. Need for vaccination for Strep pneumoniae - Pneumococcal conjugate vaccine 13-valent  5. General medical exam  Well female.  Recommend continue to work on eating healthy diet and exercise.  Recommend shingrix and tdap at pharmacy.    Meds ordered this encounter  Medications  . traMADol (ULTRAM) 50 MG tablet    Sig: Take 1 tablet (50 mg total) by mouth every 6 (six) hours as needed for moderate pain or severe pain.    Dispense:  20 tablet    Refill:  1    Orders Placed This Encounter  Procedures  . MM DIGITAL SCREENING BILATERAL  . DG Bone Density  . Pneumococcal conjugate vaccine 13-valent  . CBC with Differential/Platelet  . Comprehensive metabolic panel  . Lipid panel  . TSH     Follow-up: Return in about 6 months (around 01/10/2021) for fasting.  An After Visit Summary was printed and given to the patient.  Rochel Brome, MD [facility] [phone]

## 2020-07-12 ENCOUNTER — Other Ambulatory Visit: Payer: Self-pay

## 2020-07-12 DIAGNOSIS — Z1211 Encounter for screening for malignant neoplasm of colon: Secondary | ICD-10-CM

## 2020-07-12 LAB — COMPREHENSIVE METABOLIC PANEL
ALT: 15 IU/L (ref 0–32)
AST: 24 IU/L (ref 0–40)
Albumin/Globulin Ratio: 2.1 (ref 1.2–2.2)
Albumin: 4.6 g/dL (ref 3.7–4.7)
Alkaline Phosphatase: 102 IU/L (ref 44–121)
BUN/Creatinine Ratio: 23 (ref 12–28)
BUN: 19 mg/dL (ref 8–27)
Bilirubin Total: 0.6 mg/dL (ref 0.0–1.2)
CO2: 22 mmol/L (ref 20–29)
Calcium: 9.6 mg/dL (ref 8.7–10.3)
Chloride: 105 mmol/L (ref 96–106)
Creatinine, Ser: 0.83 mg/dL (ref 0.57–1.00)
Globulin, Total: 2.2 g/dL (ref 1.5–4.5)
Glucose: 82 mg/dL (ref 65–99)
Potassium: 4.3 mmol/L (ref 3.5–5.2)
Sodium: 142 mmol/L (ref 134–144)
Total Protein: 6.8 g/dL (ref 6.0–8.5)
eGFR: 72 mL/min/{1.73_m2} (ref >59)

## 2020-07-12 LAB — LIPID PANEL
Chol/HDL Ratio: 4.8 ratio — ABNORMAL HIGH (ref 0.0–4.4)
Cholesterol, Total: 248 mg/dL — ABNORMAL HIGH (ref 100–199)
HDL: 52 mg/dL (ref >39)
LDL Chol Calc (NIH): 166 mg/dL — ABNORMAL HIGH (ref 0–99)
Triglycerides: 162 mg/dL — ABNORMAL HIGH (ref 0–149)
VLDL Cholesterol Cal: 30 mg/dL (ref 5–40)

## 2020-07-12 LAB — CARDIOVASCULAR RISK ASSESSMENT

## 2020-07-12 LAB — CBC WITH DIFFERENTIAL/PLATELET
Basophils Absolute: 0 10*3/uL (ref 0.0–0.2)
Basos: 0 %
EOS (ABSOLUTE): 0.2 10*3/uL (ref 0.0–0.4)
Eos: 3 %
Hematocrit: 43.3 % (ref 34.0–46.6)
Hemoglobin: 14.6 g/dL (ref 11.1–15.9)
Immature Grans (Abs): 0 10*3/uL (ref 0.0–0.1)
Immature Granulocytes: 0 %
Lymphocytes Absolute: 2.3 10*3/uL (ref 0.7–3.1)
Lymphs: 34 %
MCH: 30.4 pg (ref 26.6–33.0)
MCHC: 33.7 g/dL (ref 31.5–35.7)
MCV: 90 fL (ref 79–97)
Monocytes Absolute: 0.6 10*3/uL (ref 0.1–0.9)
Monocytes: 9 %
Neutrophils Absolute: 3.6 10*3/uL (ref 1.4–7.0)
Neutrophils: 54 %
Platelets: 177 10*3/uL (ref 150–450)
RBC: 4.81 x10E6/uL (ref 3.77–5.28)
RDW: 13.4 % (ref 11.7–15.4)
WBC: 6.7 10*3/uL (ref 3.4–10.8)

## 2020-07-12 LAB — TSH: TSH: 1.2 u[IU]/mL (ref 0.450–4.500)

## 2020-07-12 NOTE — Progress Notes (Signed)
Cologuard ordered thru [employer] 

## 2020-07-14 ENCOUNTER — Telehealth: Payer: Self-pay | Admitting: Family Medicine

## 2020-07-14 NOTE — Telephone Encounter (Signed)
   Claudia Dickerson has been scheduled for the following appointment:  WHAT: mammogram and dexa WHERE: [facility] DATE: 11/07/20 TIME: 10:10 am Arrival time  Patient has been made aware.

## 2020-07-27 DIAGNOSIS — Z1211 Encounter for screening for malignant neoplasm of colon: Secondary | ICD-10-CM | POA: Diagnosis not present

## 2020-08-03 LAB — COLOGUARD: COLOGUARD: NEGATIVE

## 2020-08-04 LAB — COLOGUARD: Cologuard: NEGATIVE

## 2020-09-26 ENCOUNTER — Ambulatory Visit (INDEPENDENT_AMBULATORY_CARE_PROVIDER_SITE_OTHER): Payer: Medicare HMO

## 2020-09-26 ENCOUNTER — Other Ambulatory Visit: Payer: Self-pay

## 2020-09-26 ENCOUNTER — Ambulatory Visit: Payer: Medicare HMO | Admitting: Podiatry

## 2020-09-26 DIAGNOSIS — M722 Plantar fascial fibromatosis: Secondary | ICD-10-CM

## 2020-09-26 DIAGNOSIS — M79672 Pain in left foot: Secondary | ICD-10-CM | POA: Diagnosis not present

## 2020-09-26 DIAGNOSIS — M216X9 Other acquired deformities of unspecified foot: Secondary | ICD-10-CM

## 2020-09-26 MED ORDER — BETAMETHASONE SOD PHOS & ACET 6 (3-3) MG/ML IJ SUSP
6.0000 mg | Freq: Once | INTRAMUSCULAR | Status: AC
  Administered 2020-09-26: 6 mg

## 2020-09-26 NOTE — Progress Notes (Signed)
  Subjective:  Patient ID: Claudia Dickerson, female    DOB: March 01, 1940,  MRN: [id number]  Chief Complaint  Patient presents with   Pain    Lt bottom heel pain x 3 wks - no injury - started the day after walking barefooted - w/ swelling -per pt makes her walk funny -= 6/10 pain - wrose walking or with shoes tx: OTC topical pain rub and icing, tylenol and stretching    80 y.o. female presents with the above complaint. History confirmed with patient.   Objective:  Physical Exam: warm, good capillary refill, no trophic changes or ulcerative lesions, normal DP and PT pulses, and normal sensory exam. Left Foot: tenderness to palpation medial calcaneal tuber, no pain with calcaneal squeeze, decreased ankle joint ROM, and +Silverskiold test  Radiographs: X-ray of the left foot: no fracture, dislocation, swelling or degenerative changes noted, no evidence of calcaneal stress fracture, and no plantar calcaneal heel spur  Assessment:   1. Plantar fasciitis of left foot   2. Equinus deformity of foot   3. Pain of left heel    Plan:  Patient was evaluated and treated and all questions answered.  Plantar Fasciitis -XR reviewed with patient -Educated patient on stretching and icing of the affected limb -Plantar fascial brace dispensed -Injection delivered to the plantar fascia of the left foot.  Procedure: Injection Tendon/Ligament Consent: Verbal consent obtained. Location: Left plantar fascia at the glabrous junction; medial approach. Skin Prep: Alcohol. Injectate: 1 cc 0.5% marcaine plain, 1 cc betamethasone acetate-betamethasone sodium phosphate Disposition: Patient tolerated procedure well. Injection site dressed with a band-aid.  Return in about 1 month (around 10/27/2020) for Plantar fasciitis.

## 2020-09-26 NOTE — Patient Instructions (Signed)

## 2020-10-19 ENCOUNTER — Telehealth: Payer: Self-pay | Admitting: Podiatry

## 2020-10-19 NOTE — Telephone Encounter (Signed)
Pt states injection has not helped and would like to know if there is any meds you can call in to help. [facility] [place]  Has f/u appt 11-03-20

## 2020-10-20 MED ORDER — MELOXICAM 15 MG PO TABS: 15.0000 mg | ORAL_TABLET | Freq: Every day | ORAL | 0 refills | Status: DC

## 2020-10-20 NOTE — Addendum Note (Signed)
Addended by: Hardie Pulley on: 10/20/2020 01:09 PM   Modules accepted: Orders

## 2020-11-03 ENCOUNTER — Ambulatory Visit: Payer: Medicare HMO | Admitting: Podiatry

## 2020-11-09 DIAGNOSIS — M8589 Other specified disorders of bone density and structure, multiple sites: Secondary | ICD-10-CM | POA: Diagnosis not present

## 2020-11-09 DIAGNOSIS — M85852 Other specified disorders of bone density and structure, left thigh: Secondary | ICD-10-CM | POA: Diagnosis not present

## 2020-11-09 DIAGNOSIS — Z1231 Encounter for screening mammogram for malignant neoplasm of breast: Secondary | ICD-10-CM | POA: Diagnosis not present

## 2020-11-09 DIAGNOSIS — M810 Age-related osteoporosis without current pathological fracture: Secondary | ICD-10-CM | POA: Diagnosis not present

## 2020-11-09 LAB — HM MAMMOGRAPHY

## 2020-11-09 LAB — HM DEXA SCAN

## 2020-11-10 ENCOUNTER — Other Ambulatory Visit: Payer: Self-pay

## 2020-11-10 ENCOUNTER — Telehealth: Payer: Self-pay

## 2020-11-10 ENCOUNTER — Ambulatory Visit (INDEPENDENT_AMBULATORY_CARE_PROVIDER_SITE_OTHER): Payer: Medicare HMO | Admitting: Podiatry

## 2020-11-10 ENCOUNTER — Encounter: Payer: Self-pay | Admitting: Family Medicine

## 2020-11-10 ENCOUNTER — Encounter: Payer: Self-pay | Admitting: Podiatry

## 2020-11-10 DIAGNOSIS — M722 Plantar fascial fibromatosis: Secondary | ICD-10-CM

## 2020-11-10 NOTE — Telephone Encounter (Signed)
Called pt to give DEXA results. No answer, left VM to return call.  Results: improved, continue current treatment.   Royce Macadamia, [place] 11/10/20 1:29 PM

## 2020-11-14 DIAGNOSIS — H04123 Dry eye syndrome of bilateral lacrimal glands: Secondary | ICD-10-CM | POA: Diagnosis not present

## 2020-11-17 ENCOUNTER — Ambulatory Visit: Payer: Medicare HMO | Admitting: Podiatry

## 2020-11-17 NOTE — Progress Notes (Signed)
  Subjective:  Patient ID: Claudia Dickerson, female    DOB: [birth year],  MRN: [id number]  Chief Complaint  Patient presents with   Plantar Fasciitis    The left heel hurts and hurts when I am on it later in the day and the shot I thought was going to help    80 y.o. female presents with the above complaint. History confirmed with patient.   Objective:  Physical Exam: warm, good capillary refill, no trophic changes or ulcerative lesions, normal DP and PT pulses, and normal sensory exam. Left Foot: tenderness to palpation medial calcaneal tuber, no pain with calcaneal squeeze, decreased ankle joint ROM, and +Silverskiold test  Assessment:   1. Plantar fasciitis of left foot     Plan:  Patient was evaluated and treated and all questions answered.  Plantar Fasciitis -Unfortunately she did not have much relief with the shot.  We discussed continued stretching and icing.  Could consider repeat injection or physical therapy if pain persists   No follow-ups on file.

## 2020-11-24 ENCOUNTER — Other Ambulatory Visit: Payer: Self-pay | Admitting: Podiatry

## 2020-11-24 NOTE — Telephone Encounter (Signed)
Please advise 

## 2020-11-28 DIAGNOSIS — M79671 Pain in right foot: Secondary | ICD-10-CM | POA: Diagnosis not present

## 2020-11-28 DIAGNOSIS — S8261XA Displaced fracture of lateral malleolus of right fibula, initial encounter for closed fracture: Secondary | ICD-10-CM | POA: Diagnosis not present

## 2020-11-28 DIAGNOSIS — S82401A Unspecified fracture of shaft of right fibula, initial encounter for closed fracture: Secondary | ICD-10-CM | POA: Diagnosis not present

## 2020-11-28 DIAGNOSIS — S8262XA Displaced fracture of lateral malleolus of left fibula, initial encounter for closed fracture: Secondary | ICD-10-CM | POA: Diagnosis not present

## 2020-11-28 DIAGNOSIS — M25571 Pain in right ankle and joints of right foot: Secondary | ICD-10-CM | POA: Diagnosis not present

## 2020-11-29 DIAGNOSIS — S82831A Other fracture of upper and lower end of right fibula, initial encounter for closed fracture: Secondary | ICD-10-CM | POA: Diagnosis not present

## 2020-12-09 DIAGNOSIS — R609 Edema, unspecified: Secondary | ICD-10-CM | POA: Diagnosis not present

## 2020-12-09 DIAGNOSIS — M7989 Other specified soft tissue disorders: Secondary | ICD-10-CM | POA: Diagnosis not present

## 2020-12-12 ENCOUNTER — Ambulatory Visit: Payer: Medicare HMO | Admitting: Podiatry

## 2020-12-19 DIAGNOSIS — S82831A Other fracture of upper and lower end of right fibula, initial encounter for closed fracture: Secondary | ICD-10-CM | POA: Diagnosis not present

## 2021-01-08 ENCOUNTER — Other Ambulatory Visit: Payer: Self-pay | Admitting: Podiatry

## 2021-01-19 DIAGNOSIS — S82831A Other fracture of upper and lower end of right fibula, initial encounter for closed fracture: Secondary | ICD-10-CM | POA: Diagnosis not present

## 2021-02-05 ENCOUNTER — Other Ambulatory Visit: Payer: Self-pay | Admitting: Nurse Practitioner

## 2021-02-05 DIAGNOSIS — J310 Chronic rhinitis: Secondary | ICD-10-CM

## 2021-04-02 ENCOUNTER — Other Ambulatory Visit: Payer: Self-pay | Admitting: Family Medicine

## 2021-04-13 ENCOUNTER — Other Ambulatory Visit: Payer: Self-pay | Admitting: Podiatry

## 2021-05-02 ENCOUNTER — Ambulatory Visit: Payer: Medicare HMO | Admitting: Sports Medicine

## 2021-05-02 ENCOUNTER — Encounter: Payer: Self-pay | Admitting: Sports Medicine

## 2021-05-02 ENCOUNTER — Other Ambulatory Visit: Payer: Self-pay

## 2021-05-02 ENCOUNTER — Ambulatory Visit (INDEPENDENT_AMBULATORY_CARE_PROVIDER_SITE_OTHER): Payer: Medicare HMO

## 2021-05-02 DIAGNOSIS — M7672 Peroneal tendinitis, left leg: Secondary | ICD-10-CM

## 2021-05-02 DIAGNOSIS — M722 Plantar fascial fibromatosis: Secondary | ICD-10-CM

## 2021-05-02 DIAGNOSIS — M25572 Pain in left ankle and joints of left foot: Secondary | ICD-10-CM | POA: Diagnosis not present

## 2021-05-02 NOTE — Progress Notes (Signed)
Subjective: ?Claudia Dickerson is a 81 y.o. female patient who presents to office for evaluation of left foot and ankle pain.  Patient reports that she still has pain since October was previously treated for Planter fasciitis and received an injection and it helped for about 2 weeks states that she is having increasing pain to the side of her foot and the brace does not help.  Patient reports that he gets very sore and by the end of the day consistently painful.  Patient reports that she is concerned that there may be something more going on than the traditional Plantar fasciitis.  Patient denies any recent injury or trauma but does admit an old history of ankle injuries and fractures but nothing in the last 3 to 6 months. ? ?Patient Active Problem List  ? Diagnosis Date Noted  ? Hematuria 10/05/2019  ? Non-seasonal allergic rhinitis due to pollen 05/02/2019  ? Lumbar stenosis with neurogenic claudication 03/31/2018  ? Renal stones 10/17/2016  ? Bite, insect 08/14/2016  ? Chronic bilateral low back pain 07/18/2016  ? Chronic pain of both ankles 07/18/2016  ? Pain of both hip joints 07/18/2016  ? Myalgia 06/27/2016  ? Hypercholesterolemia 06/23/2015  ? Primary insomnia 06/23/2015  ? ? ?Current Outpatient Medications on File Prior to Visit  ?Medication Sig Dispense Refill  ? acetaminophen (TYLENOL) 500 MG tablet Take by mouth.    ? b complex vitamins tablet Take 1 tablet by mouth daily.    ? Calcium Carb-Cholecalciferol (CALCIUM 600 + D PO) Take 1 tablet by mouth daily.    ? Cholecalciferol (VITAMIN D) 50 MCG (2000 UT) CAPS Take 2,000 Units by mouth daily.    ? Cyanocobalamin 2500 MCG TABS Take 1 tablet by mouth daily.    ? diazepam (VALIUM) 5 MG tablet Take 1 tablet (5 mg total) by mouth at bedtime as needed for sedation. 30 tablet 0  ? EPINEPHrine (EPIPEN JR) 0.15 MG/0.3ML injection Inject into the muscle.    ? fexofenadine (ALLEGRA) 180 MG tablet Take 180 mg by mouth daily.    ? fluticasone (FLONASE) 50 MCG/ACT  nasal spray Use 2 spray(s) in each nostril once daily 16 g 0  ? ibuprofen (ADVIL) 200 MG tablet Take by mouth.    ? Lactobacillus Rhamnosus, GG, (RA PROBIOTIC DIGESTIVE CARE) CAPS Take 1 capsule by mouth daily.    ? magnesium gluconate (MAGONATE) 500 MG tablet Take by mouth.    ? meloxicam (MOBIC) 15 MG tablet Take 1 tablet by mouth once daily 30 tablet 0  ? Multiple Vitamin (MULTI-VITAMIN) tablet Take 1 tablet by mouth daily.    ? Potassium Gluconate 2.5 MEQ TABS Take 1 tablet by mouth daily.    ? Probiotic Product (PROBIOTIC DAILY PO) Take 1 tablet by mouth daily.    ? traMADol (ULTRAM) 50 MG tablet Take 1 tablet (50 mg total) by mouth every 6 (six) hours as needed for moderate pain or severe pain. 20 tablet 1  ? ?No current facility-administered medications on file prior to visit.  ? ? ?Allergies  ?Allergen Reactions  ? Amoxicillin Swelling  ?  Did it involve swelling of the face/tongue/throat, SOB, or low BP? Yes ?Did it involve sudden or severe rash/hives, skin peeling, or any reaction on the inside of your mouth or nose? No ?Did you need to seek medical attention at a hospital or doctor's office? No ?When did it last happen?     July 2013  ?If all above answers are ?NO?, may  proceed with cephalosporin use. ? ?  ? Xarelto [Rivaroxaban]   ? ? ?Objective:  ?General: Alert and oriented x3 in no acute distress ? ?Dermatology: No open lesions bilateral lower extremities, no webspace macerations, no ecchymosis bilateral, all nails x 10 are well manicured. ? ?Vascular: Dorsalis Pedis and Posterior Tibial pedal pulses palpable, Capillary Fill Time 3 seconds,(+) pedal hair growth bilateral, varicosities noted left greater than right, minimal trace edema to ankles left greater than right, Temperature gradient within normal limits. ? ?Neurology: Gross sensation intact via light touch bilateral. ? ?Musculoskeletal: Mild tenderness with palpation at the plantar heel however most moderate pain is noted at the peroneus brevis  insertion at the fifth metatarsal base and also along the peroneal tendon course posterior to the ankle ? ?Gait: Antalgic gait with brace ? ?Xrays  ?Left foot/ankle ?  Impression: No acute osseous findings ? ?Assessment and Plan: ?Problem List Items Addressed This Visit   ?None ?Visit Diagnoses   ? ? Plantar fasciitis of left foot    -  Primary  ? Relevant Orders  ? DG Foot Complete Left  ? Peroneal tendinitis, left      ? Relevant Orders  ? MR ANKLE LEFT WO CONTRAST  ? Pain in joint involving ankle and foot, left      ? Relevant Orders  ? MR ANKLE LEFT WO CONTRAST  ? ?  ?  ? ?-Complete examination performed ?-Xrays reviewed ?-Discussed treatement options for what I would suspect is worsening tendinitis from compensating from previous history of Planter fasciitis versus possible tendon tear along the peroneus brevis or longus ?-Rx MRI at [facility] for further evaluation of above ?-Advised patient to resume using her cam boot that she has at home however if this hurts her back to discontinue using it and to wear her Tri-Lock brace of which she has at home until we can get MRI ?-Advised patient to continue with her Mobic and may also try topical Voltaren to the area for pain and inflammation ?-Patient to return to office after MRI or sooner if condition worsens. ? ?Landis Martins, DPM ? ?

## 2021-05-03 NOTE — Addendum Note (Signed)
Addended by: Cranford Mon R on: 05/03/2021 12:54 PM ? ? Modules accepted: Orders ? ?

## 2021-05-11 ENCOUNTER — Telehealth: Payer: Self-pay | Admitting: *Deleted

## 2021-05-11 NOTE — Telephone Encounter (Signed)
Called [facility] and spoke with Hilda Blades and the procedure code [id number] was approved and the authorization number is [id number] and is valid 05-12-2021 thru 06-11-2021 and called and made an appointment for patient and the date is 05-16-2021 arrival time is 10:45 am for appointment time 11:15 am and called patient to let her know as well. Lattie Haw ?

## 2021-05-16 ENCOUNTER — Encounter: Payer: Self-pay | Admitting: Sports Medicine

## 2021-05-16 DIAGNOSIS — M7732 Calcaneal spur, left foot: Secondary | ICD-10-CM | POA: Diagnosis not present

## 2021-05-16 DIAGNOSIS — M25572 Pain in left ankle and joints of left foot: Secondary | ICD-10-CM | POA: Diagnosis not present

## 2021-05-16 DIAGNOSIS — M67472 Ganglion, left ankle and foot: Secondary | ICD-10-CM | POA: Diagnosis not present

## 2021-05-16 DIAGNOSIS — M7672 Peroneal tendinitis, left leg: Secondary | ICD-10-CM | POA: Diagnosis not present

## 2021-05-16 DIAGNOSIS — S86312A Strain of muscle(s) and tendon(s) of peroneal muscle group at lower leg level, left leg, initial encounter: Secondary | ICD-10-CM | POA: Diagnosis not present

## 2021-05-16 DIAGNOSIS — M65872 Other synovitis and tenosynovitis, left ankle and foot: Secondary | ICD-10-CM | POA: Diagnosis not present

## 2021-05-23 ENCOUNTER — Telehealth: Payer: Self-pay | Admitting: Sports Medicine

## 2021-05-23 NOTE — Telephone Encounter (Signed)
Pt called asking about her mri results. She had the mri on 4.11.2023.. I told her I would send you a message and you would let us know if we needed to schedule her or if you would call her? ?

## 2021-05-23 NOTE — Telephone Encounter (Signed)
Pt scheduled for Thursday 4.20 @ 500pm for  the phone visit. ?

## 2021-05-25 ENCOUNTER — Ambulatory Visit (INDEPENDENT_AMBULATORY_CARE_PROVIDER_SITE_OTHER): Payer: Self-pay | Admitting: Sports Medicine

## 2021-05-25 ENCOUNTER — Telehealth: Payer: Self-pay | Admitting: *Deleted

## 2021-05-25 ENCOUNTER — Encounter: Payer: Self-pay | Admitting: Sports Medicine

## 2021-05-25 DIAGNOSIS — S86312A Strain of muscle(s) and tendon(s) of peroneal muscle group at lower leg level, left leg, initial encounter: Secondary | ICD-10-CM

## 2021-05-25 DIAGNOSIS — M79672 Pain in left foot: Secondary | ICD-10-CM

## 2021-05-25 MED ORDER — MELOXICAM 15 MG PO TABS: 15.0000 mg | ORAL_TABLET | Freq: Every day | ORAL | 0 refills | Status: DC

## 2021-05-25 NOTE — Telephone Encounter (Signed)
Called [facility] and they will be faxing over the MRI report. ?

## 2021-05-25 NOTE — Progress Notes (Addendum)
Virtual Visit via Telephone Note ? ?I connected with Earleen Newport on 05/25/21 at  5:00 PM EDT by telephone and verified that I am speaking with the correct person using two identifiers. ? ?Location: ?Patient: Claudia Dickerson, Home ? ?Provider: Landis Martins, DPM, [place] office ? ?I discussed the limitations, risks, security and privacy concerns of performing an evaluation and management service by telephone and the availability of in person appointments. I also discussed with the patient that there may be a patient responsible charge related to this service. The patient expressed understanding and agreed to proceed. ? ? ?History of Present Illness: ?81 y/o met via telephone visit to discuss MRI results left ankle. Patient reports that she still has some pain occasionally off-and-on worse with increased activity and notices when she wears her hiking boot or brace the pain is less states that she is unable to wear the boot due to her low back/hip problem.  Denies any other pedal complaints at this time. ?  ?Observations/Objective: ?Exam unable to be performed due to telephone nature of visit ? ?Assessment and Plan: ?Problem List Items Addressed This Visit   ?None ?Visit Diagnoses   ? ? Peroneal tendon tear, left, initial encounter    -  Primary  ? Relevant Orders  ? Ambulatory referral to Physical Therapy  ? Pain of left heel      ? ?  ? ?Discussed with patient MRI results findings which reveal a peroneus brevis split longitudinal tear ? ?Patient at this time declined surgery ?Rx physical therapy at [facility] with Daphane Shepherd for her left foot and ankle as well as low back and advised her to also make an appt with her back doctor ?Advised patient to strictly stay with wearing her lace up brace or hiking boot since she cannot tolerate cam boot due to her back ?Refilled meloxicam anti-inflammatory for patient to take for pain and inflammation advised patient if there is additional pain in the  evening may take Tylenol ?Advised patient to use combination of heat ice and topical pain creams and rubs as needed ? ?Follow Up Instructions: ?If symptoms fail to improve after 6-8 weeks for follow up care/surgery consult with Dr. Blenda Mounts.  ?  ?I discussed the assessment and treatment plan with the patient. The patient was provided an opportunity to ask questions and all were answered. The patient agreed with the plan and demonstrated an understanding of the instructions. ?  ?The patient was advised to call back or seek an in-person evaluation if the symptoms worsen or if the condition fails to improve as anticipated. ? ?I provided 30 minutes of non-face-to-face time during this encounter. ? ? ?Landis Martins, DPM  ?

## 2021-05-25 NOTE — Telephone Encounter (Signed)
-----   Message from Landis Martins, [place] sent at 05/24/2021  8:13 PM EDT ----- ?Regarding: MRI report ?Can you get me a copy of this patient's MRI report. I signed off on it last week and put if for scan. I don't see where it is scanned in yet.  ?She had the image done at [place]. ?Thanks ?Dr. Cannon Kettle ? ? ?

## 2021-05-30 DIAGNOSIS — M545 Low back pain, unspecified: Secondary | ICD-10-CM | POA: Diagnosis not present

## 2021-05-30 DIAGNOSIS — M79662 Pain in left lower leg: Secondary | ICD-10-CM | POA: Diagnosis not present

## 2021-05-30 DIAGNOSIS — M79672 Pain in left foot: Secondary | ICD-10-CM | POA: Diagnosis not present

## 2021-05-30 DIAGNOSIS — S86312D Strain of muscle(s) and tendon(s) of peroneal muscle group at lower leg level, left leg, subsequent encounter: Secondary | ICD-10-CM | POA: Diagnosis not present

## 2021-05-30 DIAGNOSIS — R2689 Other abnormalities of gait and mobility: Secondary | ICD-10-CM | POA: Diagnosis not present

## 2021-06-02 DIAGNOSIS — M79662 Pain in left lower leg: Secondary | ICD-10-CM | POA: Diagnosis not present

## 2021-06-02 DIAGNOSIS — M79672 Pain in left foot: Secondary | ICD-10-CM | POA: Diagnosis not present

## 2021-06-02 DIAGNOSIS — S86312D Strain of muscle(s) and tendon(s) of peroneal muscle group at lower leg level, left leg, subsequent encounter: Secondary | ICD-10-CM | POA: Diagnosis not present

## 2021-06-02 DIAGNOSIS — M545 Low back pain, unspecified: Secondary | ICD-10-CM | POA: Diagnosis not present

## 2021-06-02 DIAGNOSIS — R2689 Other abnormalities of gait and mobility: Secondary | ICD-10-CM | POA: Diagnosis not present

## 2021-06-05 DIAGNOSIS — M545 Low back pain, unspecified: Secondary | ICD-10-CM | POA: Diagnosis not present

## 2021-06-05 DIAGNOSIS — S86312D Strain of muscle(s) and tendon(s) of peroneal muscle group at lower leg level, left leg, subsequent encounter: Secondary | ICD-10-CM | POA: Diagnosis not present

## 2021-06-05 DIAGNOSIS — R2689 Other abnormalities of gait and mobility: Secondary | ICD-10-CM | POA: Diagnosis not present

## 2021-06-05 DIAGNOSIS — M79672 Pain in left foot: Secondary | ICD-10-CM | POA: Diagnosis not present

## 2021-06-05 DIAGNOSIS — M79662 Pain in left lower leg: Secondary | ICD-10-CM | POA: Diagnosis not present

## 2021-06-08 DIAGNOSIS — M545 Low back pain, unspecified: Secondary | ICD-10-CM | POA: Diagnosis not present

## 2021-06-08 DIAGNOSIS — M79662 Pain in left lower leg: Secondary | ICD-10-CM | POA: Diagnosis not present

## 2021-06-08 DIAGNOSIS — S86312D Strain of muscle(s) and tendon(s) of peroneal muscle group at lower leg level, left leg, subsequent encounter: Secondary | ICD-10-CM | POA: Diagnosis not present

## 2021-06-08 DIAGNOSIS — R2689 Other abnormalities of gait and mobility: Secondary | ICD-10-CM | POA: Diagnosis not present

## 2021-06-08 DIAGNOSIS — M79672 Pain in left foot: Secondary | ICD-10-CM | POA: Diagnosis not present

## 2021-06-14 DIAGNOSIS — M545 Low back pain, unspecified: Secondary | ICD-10-CM | POA: Diagnosis not present

## 2021-06-14 DIAGNOSIS — R2689 Other abnormalities of gait and mobility: Secondary | ICD-10-CM | POA: Diagnosis not present

## 2021-06-14 DIAGNOSIS — S86312D Strain of muscle(s) and tendon(s) of peroneal muscle group at lower leg level, left leg, subsequent encounter: Secondary | ICD-10-CM | POA: Diagnosis not present

## 2021-06-14 DIAGNOSIS — M79672 Pain in left foot: Secondary | ICD-10-CM | POA: Diagnosis not present

## 2021-06-14 DIAGNOSIS — M79662 Pain in left lower leg: Secondary | ICD-10-CM | POA: Diagnosis not present

## 2021-06-19 DIAGNOSIS — M79662 Pain in left lower leg: Secondary | ICD-10-CM | POA: Diagnosis not present

## 2021-06-19 DIAGNOSIS — M545 Low back pain, unspecified: Secondary | ICD-10-CM | POA: Diagnosis not present

## 2021-06-19 DIAGNOSIS — S86312D Strain of muscle(s) and tendon(s) of peroneal muscle group at lower leg level, left leg, subsequent encounter: Secondary | ICD-10-CM | POA: Diagnosis not present

## 2021-06-19 DIAGNOSIS — R2689 Other abnormalities of gait and mobility: Secondary | ICD-10-CM | POA: Diagnosis not present

## 2021-06-19 DIAGNOSIS — M79672 Pain in left foot: Secondary | ICD-10-CM | POA: Diagnosis not present

## 2021-06-21 DIAGNOSIS — S86312D Strain of muscle(s) and tendon(s) of peroneal muscle group at lower leg level, left leg, subsequent encounter: Secondary | ICD-10-CM | POA: Diagnosis not present

## 2021-06-21 DIAGNOSIS — M79662 Pain in left lower leg: Secondary | ICD-10-CM | POA: Diagnosis not present

## 2021-06-21 DIAGNOSIS — M79672 Pain in left foot: Secondary | ICD-10-CM | POA: Diagnosis not present

## 2021-06-21 DIAGNOSIS — M545 Low back pain, unspecified: Secondary | ICD-10-CM | POA: Diagnosis not present

## 2021-06-21 DIAGNOSIS — R2689 Other abnormalities of gait and mobility: Secondary | ICD-10-CM | POA: Diagnosis not present

## 2021-06-22 DIAGNOSIS — B9689 Other specified bacterial agents as the cause of diseases classified elsewhere: Secondary | ICD-10-CM | POA: Diagnosis not present

## 2021-06-22 DIAGNOSIS — J019 Acute sinusitis, unspecified: Secondary | ICD-10-CM | POA: Diagnosis not present

## 2021-06-26 DIAGNOSIS — M545 Low back pain, unspecified: Secondary | ICD-10-CM | POA: Diagnosis not present

## 2021-06-26 DIAGNOSIS — R2689 Other abnormalities of gait and mobility: Secondary | ICD-10-CM | POA: Diagnosis not present

## 2021-06-26 DIAGNOSIS — S86312D Strain of muscle(s) and tendon(s) of peroneal muscle group at lower leg level, left leg, subsequent encounter: Secondary | ICD-10-CM | POA: Diagnosis not present

## 2021-06-26 DIAGNOSIS — M79662 Pain in left lower leg: Secondary | ICD-10-CM | POA: Diagnosis not present

## 2021-06-26 DIAGNOSIS — M79672 Pain in left foot: Secondary | ICD-10-CM | POA: Diagnosis not present

## 2021-07-05 DIAGNOSIS — R2689 Other abnormalities of gait and mobility: Secondary | ICD-10-CM | POA: Diagnosis not present

## 2021-07-05 DIAGNOSIS — S86312D Strain of muscle(s) and tendon(s) of peroneal muscle group at lower leg level, left leg, subsequent encounter: Secondary | ICD-10-CM | POA: Diagnosis not present

## 2021-07-05 DIAGNOSIS — M79662 Pain in left lower leg: Secondary | ICD-10-CM | POA: Diagnosis not present

## 2021-07-05 DIAGNOSIS — M545 Low back pain, unspecified: Secondary | ICD-10-CM | POA: Diagnosis not present

## 2021-07-05 DIAGNOSIS — M79672 Pain in left foot: Secondary | ICD-10-CM | POA: Diagnosis not present

## 2021-07-10 ENCOUNTER — Other Ambulatory Visit: Payer: Self-pay | Admitting: Sports Medicine

## 2021-07-10 DIAGNOSIS — M79662 Pain in left lower leg: Secondary | ICD-10-CM | POA: Diagnosis not present

## 2021-07-10 DIAGNOSIS — S86312D Strain of muscle(s) and tendon(s) of peroneal muscle group at lower leg level, left leg, subsequent encounter: Secondary | ICD-10-CM | POA: Diagnosis not present

## 2021-07-10 DIAGNOSIS — R2689 Other abnormalities of gait and mobility: Secondary | ICD-10-CM | POA: Diagnosis not present

## 2021-07-10 DIAGNOSIS — M79672 Pain in left foot: Secondary | ICD-10-CM | POA: Diagnosis not present

## 2021-07-10 DIAGNOSIS — M545 Low back pain, unspecified: Secondary | ICD-10-CM | POA: Diagnosis not present

## 2021-07-12 DIAGNOSIS — M79662 Pain in left lower leg: Secondary | ICD-10-CM | POA: Diagnosis not present

## 2021-07-12 DIAGNOSIS — M545 Low back pain, unspecified: Secondary | ICD-10-CM | POA: Diagnosis not present

## 2021-07-12 DIAGNOSIS — R2689 Other abnormalities of gait and mobility: Secondary | ICD-10-CM | POA: Diagnosis not present

## 2021-07-12 DIAGNOSIS — M79672 Pain in left foot: Secondary | ICD-10-CM | POA: Diagnosis not present

## 2021-07-12 DIAGNOSIS — S86312D Strain of muscle(s) and tendon(s) of peroneal muscle group at lower leg level, left leg, subsequent encounter: Secondary | ICD-10-CM | POA: Diagnosis not present

## 2021-07-12 NOTE — Telephone Encounter (Signed)
Please advise 

## 2021-07-18 DIAGNOSIS — M545 Low back pain, unspecified: Secondary | ICD-10-CM | POA: Diagnosis not present

## 2021-07-18 DIAGNOSIS — M79672 Pain in left foot: Secondary | ICD-10-CM | POA: Diagnosis not present

## 2021-07-18 DIAGNOSIS — S86312D Strain of muscle(s) and tendon(s) of peroneal muscle group at lower leg level, left leg, subsequent encounter: Secondary | ICD-10-CM | POA: Diagnosis not present

## 2021-07-18 DIAGNOSIS — R2689 Other abnormalities of gait and mobility: Secondary | ICD-10-CM | POA: Diagnosis not present

## 2021-07-18 DIAGNOSIS — M79662 Pain in left lower leg: Secondary | ICD-10-CM | POA: Diagnosis not present

## 2021-07-20 DIAGNOSIS — M79672 Pain in left foot: Secondary | ICD-10-CM | POA: Diagnosis not present

## 2021-07-20 DIAGNOSIS — M79662 Pain in left lower leg: Secondary | ICD-10-CM | POA: Diagnosis not present

## 2021-07-20 DIAGNOSIS — R2689 Other abnormalities of gait and mobility: Secondary | ICD-10-CM | POA: Diagnosis not present

## 2021-07-20 DIAGNOSIS — S86312D Strain of muscle(s) and tendon(s) of peroneal muscle group at lower leg level, left leg, subsequent encounter: Secondary | ICD-10-CM | POA: Diagnosis not present

## 2021-07-20 DIAGNOSIS — M545 Low back pain, unspecified: Secondary | ICD-10-CM | POA: Diagnosis not present

## 2021-07-25 ENCOUNTER — Ambulatory Visit: Payer: Medicare HMO | Admitting: Family Medicine

## 2021-08-04 ENCOUNTER — Ambulatory Visit (INDEPENDENT_AMBULATORY_CARE_PROVIDER_SITE_OTHER): Payer: Medicare HMO | Admitting: Family Medicine

## 2021-08-04 VITALS — BP 124/72 | HR 80 | Temp 96.8°F | Resp 16 | Ht 65.0 in | Wt 161.0 lb

## 2021-08-04 DIAGNOSIS — R200 Anesthesia of skin: Secondary | ICD-10-CM | POA: Diagnosis not present

## 2021-08-04 DIAGNOSIS — E782 Mixed hyperlipidemia: Secondary | ICD-10-CM

## 2021-08-04 DIAGNOSIS — M545 Low back pain, unspecified: Secondary | ICD-10-CM | POA: Diagnosis not present

## 2021-08-04 DIAGNOSIS — M791 Myalgia, unspecified site: Secondary | ICD-10-CM

## 2021-08-04 DIAGNOSIS — R202 Paresthesia of skin: Secondary | ICD-10-CM

## 2021-08-04 MED ORDER — DIAZEPAM 2 MG PO TABS: 2.0000 mg | ORAL_TABLET | Freq: Every day | ORAL | 0 refills | Status: DC | PRN

## 2021-08-04 MED ORDER — TRAMADOL HCL 50 MG PO TABS: 50.0000 mg | ORAL_TABLET | Freq: Four times a day (QID) | ORAL | 1 refills | Status: DC | PRN

## 2021-08-04 NOTE — Progress Notes (Unsigned)
Subjective:  Patient ID: Claudia Dickerson, female    DOB: 1940-12-24  Age: 81 y.o. MRN: [id number]  Chief Complaint  Patient presents with   Hyperlipidemia    HPI  Podiatry: has seen several times in the last year.  Had injections in foot x 3. helped plantar fasciitis. one area continued to hurt and did a mri of foot. showed a torn tendon. Offered surgery, but she is instead doing physical therapy and a compression sock. Given meloxicam 15 mg daily. She felt like it made her brain foggy.   Patient is complaining of lumbar back pain. Had lumbar fusion in 2020. Her back has started hurting again. Limiting, but not awful every day, but it is some times. Aching down right leg.  Has taken naproxen scheduled for 3 days without significant improvement.  She has tried Tylenol on a regular basis for few days unsure if helped.  Tramadol has helped her back in the past. Usually takes 50 mg 1/2 daily.   Leg cramps bilaterally. She has been using thera works foam in the last week. May be helping. Muscle cramps years, but worsening now.   Hyperlipidemia: LDL up last year  Current Outpatient Medications on File Prior to Visit  Medication Sig Dispense Refill   acetaminophen (TYLENOL) 500 MG tablet Take by mouth.     b complex vitamins tablet Take 1 tablet by mouth daily.     Calcium Carb-Cholecalciferol (CALCIUM 600 + D PO) Take 1 tablet by mouth daily.     Cholecalciferol (VITAMIN D3 PO) Take by mouth.     Lactobacillus Rhamnosus, GG, (RA PROBIOTIC DIGESTIVE CARE) CAPS Take 1 capsule by mouth daily.     magnesium gluconate (MAGONATE) 500 MG tablet Take by mouth.     meloxicam (MOBIC) 15 MG tablet Take 1 tablet by mouth once daily 30 tablet 0   Multiple Vitamin (MULTI-VITAMIN) tablet Take 1 tablet by mouth daily.     Potassium Gluconate 2.5 MEQ TABS Take 1 tablet by mouth daily.     traMADol (ULTRAM) 50 MG tablet Take 1 tablet (50 mg total) by mouth every 6 (six) hours as needed for moderate pain or  severe pain. 20 tablet 1   VITAMIN D PO Take by mouth.     fexofenadine (ALLEGRA) 180 MG tablet Take 180 mg by mouth daily.     No current facility-administered medications on file prior to visit.   Past Medical History:  Diagnosis Date   Arthritis    Complication of anesthesia    History of kidney stones    IN THE LEFT KIDNEY   PONV (postoperative nausea and vomiting)    JUST WITH 2 SURGERIES   Past Surgical History:  Procedure Laterality Date   ABDOMINAL HYSTERECTOMY     BRAIN SURGERY     MENGIOMA IN 2005   EYE SURGERY     BIL CATARACTS   LAMINECTOMY  03/31/2018    No family history on file. Social History   Socioeconomic History   Marital status: Married    Spouse name: Not on file   Number of children: Not on file   Years of education: Not on file   Highest education level: Not on file  Occupational History   Not on file  Tobacco Use   Smoking status: Former    Types: Cigarettes    Quit date: 52    Years since quitting: 42.5   Smokeless tobacco: Never  Substance and Sexual Activity   Alcohol use:  Yes    Comment: OCCASIONAL GLASS OF WINE (ONCE A MONTH)   Drug use: Never   Sexual activity: Not on file  Other Topics Concern   Not on file  Social History Narrative   Not on file   Social Determinants of Health   Financial Resource Strain: Not on file  Food Insecurity: Not on file  Transportation Needs: Not on file  Physical Activity: Not on file  Stress: Not on file  Social Connections: Not on file    Review of Systems  Constitutional:  Negative for chills, fatigue and fever.  HENT:  Negative for congestion, rhinorrhea and sore throat.   Respiratory:  Negative for cough and shortness of breath.   Cardiovascular:  Negative for chest pain.  Gastrointestinal:  Negative for abdominal pain, constipation, diarrhea, nausea and vomiting.  Genitourinary:  Negative for dysuria and urgency.  Musculoskeletal:  Positive for back pain. Negative for myalgias.   Neurological:  Negative for dizziness, weakness, light-headedness and headaches.  Psychiatric/Behavioral:  Negative for dysphoric mood. The patient is not nervous/anxious and is not hyperactive.      Objective:  BP 124/72   Pulse 80   Temp (!) 96.8 F (36 C)   Resp 16   Ht '5\' 5"'$  (1.651 m)   Wt 161 lb (73 kg)   BMI 26.79 kg/m      08/04/2021   11:31 AM 07/11/2020    2:38 PM 02/04/2020    7:51 AM  BP/Weight  Systolic BP 865 784 696  Diastolic BP 72 70 72  Wt. (Lbs) 161 164.6 155  BMI 26.79 kg/m2 27.39 kg/m2 25.79 kg/m2    Physical Exam  Diabetic Foot Exam - Simple   No data filed      Lab Results  Component Value Date   WBC 6.7 07/11/2020   HGB 14.6 07/11/2020   HCT 43.3 07/11/2020   PLT 177 07/11/2020   GLUCOSE 82 07/11/2020   CHOL 248 (H) 07/11/2020   TRIG 162 (H) 07/11/2020   HDL 52 07/11/2020   LDLCALC 166 (H) 07/11/2020   ALT 15 07/11/2020   AST 24 07/11/2020   NA 142 07/11/2020   K 4.3 07/11/2020   CL 105 07/11/2020   CREATININE 0.83 07/11/2020   BUN 19 07/11/2020   CO2 22 07/11/2020   TSH 1.200 07/11/2020      Assessment & Plan:   Problem List Items Addressed This Visit   None .  No orders of the defined types were placed in this encounter.   No orders of the defined types were placed in this encounter.    Follow-up: No follow-ups on file.  An After Visit Summary was printed and given to the patient.  Rochel Brome, MD [facility] [phone]

## 2021-08-06 ENCOUNTER — Encounter: Payer: Self-pay | Admitting: Family Medicine

## 2021-08-06 DIAGNOSIS — E782 Mixed hyperlipidemia: Secondary | ICD-10-CM | POA: Insufficient documentation

## 2021-08-06 DIAGNOSIS — R200 Anesthesia of skin: Secondary | ICD-10-CM | POA: Insufficient documentation

## 2021-08-06 NOTE — Assessment & Plan Note (Addendum)
Continue tramadol. Dog

## 2021-08-06 NOTE — Assessment & Plan Note (Signed)
Poorly controlled. Refuses medicines.  Continue to work on eating a healthy diet and exercise.  Labs drawn today.

## 2021-08-06 NOTE — Assessment & Plan Note (Signed)
Check labs 

## 2021-08-07 LAB — COMPREHENSIVE METABOLIC PANEL
ALT: 20 IU/L (ref 0–32)
AST: 23 IU/L (ref 0–40)
Albumin/Globulin Ratio: 2.2 (ref 1.2–2.2)
Albumin: 4.4 g/dL (ref 3.7–4.7)
Alkaline Phosphatase: 103 IU/L (ref 44–121)
BUN/Creatinine Ratio: 19 (ref 12–28)
BUN: 17 mg/dL (ref 8–27)
Bilirubin Total: 0.5 mg/dL (ref 0.0–1.2)
CO2: 24 mmol/L (ref 20–29)
Calcium: 9.5 mg/dL (ref 8.7–10.3)
Chloride: 105 mmol/L (ref 96–106)
Creatinine, Ser: 0.91 mg/dL (ref 0.57–1.00)
Globulin, Total: 2 g/dL (ref 1.5–4.5)
Glucose: 89 mg/dL (ref 70–99)
Potassium: 4.4 mmol/L (ref 3.5–5.2)
Sodium: 144 mmol/L (ref 134–144)
Total Protein: 6.4 g/dL (ref 6.0–8.5)
eGFR: 64 mL/min/{1.73_m2} (ref >59)

## 2021-08-07 LAB — CBC WITH DIFFERENTIAL/PLATELET
Basophils Absolute: 0 10*3/uL (ref 0.0–0.2)
Basos: 0 %
EOS (ABSOLUTE): 0.1 10*3/uL (ref 0.0–0.4)
Eos: 2 %
Hematocrit: 41 % (ref 34.0–46.6)
Hemoglobin: 14.2 g/dL (ref 11.1–15.9)
Immature Grans (Abs): 0 10*3/uL (ref 0.0–0.1)
Immature Granulocytes: 0 %
Lymphocytes Absolute: 2.1 10*3/uL (ref 0.7–3.1)
Lymphs: 31 %
MCH: 30.9 pg (ref 26.6–33.0)
MCHC: 34.6 g/dL (ref 31.5–35.7)
MCV: 89 fL (ref 79–97)
Monocytes Absolute: 0.7 10*3/uL (ref 0.1–0.9)
Monocytes: 11 %
Neutrophils Absolute: 3.8 10*3/uL (ref 1.4–7.0)
Neutrophils: 56 %
Platelets: 201 10*3/uL (ref 150–450)
RBC: 4.6 x10E6/uL (ref 3.77–5.28)
RDW: 12.7 % (ref 11.7–15.4)
WBC: 6.7 10*3/uL (ref 3.4–10.8)

## 2021-08-07 LAB — TSH: TSH: 1.06 u[IU]/mL (ref 0.450–4.500)

## 2021-08-07 LAB — LIPID PANEL
Chol/HDL Ratio: 3.9 ratio (ref 0.0–4.4)
Cholesterol, Total: 236 mg/dL — ABNORMAL HIGH (ref 100–199)
HDL: 60 mg/dL (ref >39)
LDL Chol Calc (NIH): 153 mg/dL — ABNORMAL HIGH (ref 0–99)
Triglycerides: 128 mg/dL (ref 0–149)
VLDL Cholesterol Cal: 23 mg/dL (ref 5–40)

## 2021-08-07 LAB — METHYLMALONIC ACID, SERUM: Methylmalonic Acid: 198 nmol/L (ref 0–378)

## 2021-08-07 LAB — PHOSPHORUS: Phosphorus: 3.5 mg/dL (ref 3.0–4.3)

## 2021-08-07 LAB — B12 AND FOLATE PANEL
Folate: >20 ng/mL (ref >3.0)
Vitamin B-12: 342 pg/mL (ref 232–1245)

## 2021-08-07 LAB — MAGNESIUM: Magnesium: 1.8 mg/dL (ref 1.6–2.3)

## 2021-08-07 LAB — CARDIOVASCULAR RISK ASSESSMENT

## 2021-08-08 NOTE — Progress Notes (Signed)
Blood count normal.  Liver function normal.  Kidney function normal.  Thyroid function normal.  Cholesterol: LDL much too high 153.  I would recommend Crestor 10 mg nightly with coenzyme every 10 to help prevent muscle pain.  Particularly because she is already having some muscle pain. Magnesium was low normal.She could try increasing her magnesium supplement to 2 daily Rest of vitamin levels were normal.  Patient continues to have cramps she could purchase Hylands cramps OTC

## 2021-08-09 ENCOUNTER — Other Ambulatory Visit: Payer: Self-pay

## 2021-08-09 MED ORDER — ROSUVASTATIN CALCIUM 10 MG PO TABS: 10.0000 mg | ORAL_TABLET | Freq: Every day | ORAL | 1 refills | Status: DC

## 2021-08-18 ENCOUNTER — Telehealth: Payer: Self-pay

## 2021-08-18 NOTE — Telephone Encounter (Signed)
Patient started rosuvastatin on 7/8. Since beginning medicine she has not felt the best the next day, taking at night. She complains of fatigue, "legs feel heavy," she does not feel herself.  Symptoms began 7/9, struggled to stay awake at work began at 2 pm. Denies urinary symptoms, fever, chest pain/tightness.   Royce Macadamia, [place] 08/18/21 9:41 AM

## 2021-08-18 NOTE — Telephone Encounter (Signed)
Patient was informed.

## 2021-11-10 DIAGNOSIS — M48062 Spinal stenosis, lumbar region with neurogenic claudication: Secondary | ICD-10-CM | POA: Diagnosis not present

## 2021-11-10 DIAGNOSIS — M4316 Spondylolisthesis, lumbar region: Secondary | ICD-10-CM | POA: Diagnosis not present

## 2021-12-01 ENCOUNTER — Other Ambulatory Visit: Payer: Self-pay | Admitting: Podiatry

## 2021-12-04 DIAGNOSIS — H04123 Dry eye syndrome of bilateral lacrimal glands: Secondary | ICD-10-CM | POA: Diagnosis not present

## 2021-12-12 DIAGNOSIS — M5116 Intervertebral disc disorders with radiculopathy, lumbar region: Secondary | ICD-10-CM | POA: Diagnosis not present

## 2021-12-12 DIAGNOSIS — M5416 Radiculopathy, lumbar region: Secondary | ICD-10-CM | POA: Diagnosis not present

## 2021-12-20 DIAGNOSIS — R300 Dysuria: Secondary | ICD-10-CM | POA: Diagnosis not present

## 2021-12-20 DIAGNOSIS — N3001 Acute cystitis with hematuria: Secondary | ICD-10-CM | POA: Diagnosis not present

## 2021-12-20 DIAGNOSIS — R030 Elevated blood-pressure reading, without diagnosis of hypertension: Secondary | ICD-10-CM | POA: Diagnosis not present

## 2022-01-05 ENCOUNTER — Other Ambulatory Visit: Payer: Self-pay | Admitting: Family Medicine

## 2022-02-06 ENCOUNTER — Telehealth: Payer: Medicare HMO | Admitting: Family Medicine

## 2022-02-23 DIAGNOSIS — J019 Acute sinusitis, unspecified: Secondary | ICD-10-CM | POA: Diagnosis not present

## 2022-03-20 DIAGNOSIS — J00 Acute nasopharyngitis [common cold]: Secondary | ICD-10-CM | POA: Diagnosis not present

## 2022-05-09 ENCOUNTER — Other Ambulatory Visit: Payer: Self-pay | Admitting: Family Medicine

## 2022-05-09 DIAGNOSIS — M545 Low back pain, unspecified: Secondary | ICD-10-CM

## 2022-06-05 ENCOUNTER — Other Ambulatory Visit: Payer: Self-pay | Admitting: Neurological Surgery

## 2022-06-05 DIAGNOSIS — M4316 Spondylolisthesis, lumbar region: Secondary | ICD-10-CM

## 2022-07-07 ENCOUNTER — Other Ambulatory Visit: Payer: Medicare HMO

## 2022-07-12 ENCOUNTER — Encounter: Payer: Self-pay | Admitting: Neurological Surgery

## 2022-07-14 DIAGNOSIS — N3001 Acute cystitis with hematuria: Secondary | ICD-10-CM | POA: Diagnosis not present

## 2022-07-19 ENCOUNTER — Ambulatory Visit
Admission: RE | Admit: 2022-07-19 | Discharge: 2022-07-19 | Disposition: A | Discharge status: Home / Self Care | Payer: Medicare HMO | Source: Ambulatory Visit | Attending: Neurological Surgery | Admitting: Neurological Surgery

## 2022-07-19 DIAGNOSIS — M5184 Other intervertebral disc disorders, thoracic region: Secondary | ICD-10-CM | POA: Diagnosis not present

## 2022-07-19 DIAGNOSIS — M47816 Spondylosis without myelopathy or radiculopathy, lumbar region: Secondary | ICD-10-CM | POA: Diagnosis not present

## 2022-07-19 DIAGNOSIS — M5186 Other intervertebral disc disorders, lumbar region: Secondary | ICD-10-CM | POA: Diagnosis not present

## 2022-07-19 DIAGNOSIS — I739 Peripheral vascular disease, unspecified: Secondary | ICD-10-CM | POA: Diagnosis not present

## 2022-07-19 DIAGNOSIS — M48061 Spinal stenosis, lumbar region without neurogenic claudication: Secondary | ICD-10-CM | POA: Diagnosis not present

## 2022-07-19 DIAGNOSIS — M25572 Pain in left ankle and joints of left foot: Secondary | ICD-10-CM | POA: Diagnosis not present

## 2022-07-19 DIAGNOSIS — M545 Low back pain, unspecified: Secondary | ICD-10-CM | POA: Diagnosis not present

## 2022-07-19 DIAGNOSIS — M4316 Spondylolisthesis, lumbar region: Secondary | ICD-10-CM

## 2022-07-19 MED ORDER — GADOPICLENOL 0.5 MMOL/ML IV SOLN
7.0000 mL | Freq: Once | INTRAVENOUS | Status: AC | PRN
  Administered 2022-07-19: 7 mL via INTRAVENOUS

## 2022-08-01 DIAGNOSIS — Z6826 Body mass index (BMI) 26.0-26.9, adult: Secondary | ICD-10-CM | POA: Diagnosis not present

## 2022-08-01 DIAGNOSIS — M48062 Spinal stenosis, lumbar region with neurogenic claudication: Secondary | ICD-10-CM | POA: Diagnosis not present

## 2022-08-10 DIAGNOSIS — D329 Benign neoplasm of meninges, unspecified: Secondary | ICD-10-CM | POA: Diagnosis not present

## 2022-08-10 DIAGNOSIS — Z86718 Personal history of other venous thrombosis and embolism: Secondary | ICD-10-CM | POA: Diagnosis not present

## 2022-08-10 DIAGNOSIS — I739 Peripheral vascular disease, unspecified: Secondary | ICD-10-CM | POA: Diagnosis not present

## 2022-08-10 DIAGNOSIS — M7989 Other specified soft tissue disorders: Secondary | ICD-10-CM | POA: Diagnosis not present

## 2022-08-27 DIAGNOSIS — R350 Frequency of micturition: Secondary | ICD-10-CM | POA: Diagnosis not present

## 2022-09-11 DIAGNOSIS — M48062 Spinal stenosis, lumbar region with neurogenic claudication: Secondary | ICD-10-CM | POA: Diagnosis not present

## 2022-09-11 DIAGNOSIS — M5416 Radiculopathy, lumbar region: Secondary | ICD-10-CM | POA: Diagnosis not present

## 2022-11-16 DIAGNOSIS — M48062 Spinal stenosis, lumbar region with neurogenic claudication: Secondary | ICD-10-CM | POA: Diagnosis not present

## 2022-11-16 DIAGNOSIS — Z6826 Body mass index (BMI) 26.0-26.9, adult: Secondary | ICD-10-CM | POA: Diagnosis not present

## 2022-12-19 DIAGNOSIS — H04123 Dry eye syndrome of bilateral lacrimal glands: Secondary | ICD-10-CM | POA: Diagnosis not present

## 2022-12-25 ENCOUNTER — Encounter: Payer: Self-pay | Admitting: Emergency Medicine

## 2023-05-14 NOTE — Patient Instructions (Signed)

## 2023-05-15 ENCOUNTER — Ambulatory Visit (INDEPENDENT_AMBULATORY_CARE_PROVIDER_SITE_OTHER): Admitting: Family Medicine

## 2023-05-15 ENCOUNTER — Encounter: Payer: Self-pay | Admitting: Family Medicine

## 2023-05-15 VITALS — BP 148/82 | HR 84 | Temp 97.6°F | Resp 16 | Ht 65.0 in | Wt 164.4 lb

## 2023-05-15 DIAGNOSIS — M25572 Pain in left ankle and joints of left foot: Secondary | ICD-10-CM | POA: Diagnosis not present

## 2023-05-15 DIAGNOSIS — R030 Elevated blood-pressure reading, without diagnosis of hypertension: Secondary | ICD-10-CM | POA: Insufficient documentation

## 2023-05-15 DIAGNOSIS — R200 Anesthesia of skin: Secondary | ICD-10-CM

## 2023-05-15 DIAGNOSIS — M545 Low back pain, unspecified: Secondary | ICD-10-CM | POA: Diagnosis not present

## 2023-05-15 DIAGNOSIS — Z0000 Encounter for general adult medical examination without abnormal findings: Secondary | ICD-10-CM

## 2023-05-15 DIAGNOSIS — E782 Mixed hyperlipidemia: Secondary | ICD-10-CM

## 2023-05-15 MED ORDER — MELOXICAM 15 MG PO TABS: 15.0000 mg | ORAL_TABLET | Freq: Every day | ORAL | 0 refills | Status: DC

## 2023-05-15 MED ORDER — DIAZEPAM 2 MG PO TABS: 2.0000 mg | ORAL_TABLET | Freq: Every day | ORAL | 0 refills | Status: DC | PRN

## 2023-05-15 MED ORDER — TRAMADOL HCL 50 MG PO TABS: 50.0000 mg | ORAL_TABLET | Freq: Four times a day (QID) | ORAL | 0 refills | Status: AC | PRN

## 2023-05-15 NOTE — Assessment & Plan Note (Signed)
 Swelling and redness in the left lower extremity with history of prior blood clot and ankle fracture. Ultrasound negative for current clots. Differential includes venous insufficiency, arthritis, neuropathy. Cardiac or renal issues considered. Nerve conduction test discussed. - Order nerve conduction test to evaluate for neuropathy. - Order labs to assess renal and cardiac function. - Advise wearing compression stockings.

## 2023-05-15 NOTE — Progress Notes (Deleted)
 Subjective:  Patient ID: Claudia Dickerson, female    DOB: 26-Jul-1940  Age: 83 y.o. MRN: [id number]  Chief Complaint  Patient presents with   Annual Wellness Visit    Discussed the use of AI scribe software for clinical note transcription with the patient, who gave verbal consent to proceed.         08/04/2021   11:37 AM 07/11/2020    2:44 PM 05/02/2019   10:55 PM  Depression screen PHQ 2/9  Decreased Interest 0 0 0  Down, Depressed, Hopeless 0 0 0  PHQ - 2 Score 0 0 0        08/04/2021   11:37 AM  Fall Risk   Falls in the past year? 1  Number falls in past yr: 0  Injury with Fall? 1  Risk for fall due to : History of fall(s)  Follow up Falls evaluation completed    Patient Care Team: Blane Ohara, MD as PCP - General (Family Medicine)   Review of Systems  Constitutional:  Negative for chills, fatigue and fever.  HENT:  Negative for congestion, ear pain, rhinorrhea and sore throat.   Respiratory:  Negative for cough and shortness of breath.   Cardiovascular:  Negative for chest pain.  Gastrointestinal:  Negative for abdominal pain, constipation, diarrhea, nausea and vomiting.  Genitourinary:  Negative for dysuria and urgency.  Musculoskeletal:  Negative for back pain and myalgias.  Neurological:  Negative for dizziness, weakness, light-headedness and headaches.  Psychiatric/Behavioral:  Negative for dysphoric mood. The patient is not nervous/anxious.     Current Outpatient Medications on File Prior to Visit  Medication Sig Dispense Refill   acetaminophen (TYLENOL) 500 MG tablet Take by mouth.     b complex vitamins tablet Take 1 tablet by mouth daily.     Calcium Carb-Cholecalciferol (CALCIUM 600 + D PO) Take 1 tablet by mouth daily.     Cholecalciferol (VITAMIN D3 PO) Take by mouth.     diazepam (VALIUM) 2 MG tablet TAKE 1 TABLET BY MOUTH ONCE DAILY AS NEEDED FOR MUSCLE SPASM 30 tablet 0   fexofenadine (ALLEGRA) 180 MG tablet Take 180 mg by mouth daily.      Lactobacillus Rhamnosus, GG, (RA PROBIOTIC DIGESTIVE CARE) CAPS Take 1 capsule by mouth daily.     magnesium gluconate (MAGONATE) 500 MG tablet Take by mouth.     meloxicam (MOBIC) 15 MG tablet Take 1 tablet by mouth once daily 30 tablet 0   Multiple Vitamin (MULTI-VITAMIN) tablet Take 1 tablet by mouth daily.     Potassium Gluconate 2.5 MEQ TABS Take 1 tablet by mouth daily.     rosuvastatin (CRESTOR) 10 MG tablet Take 1 tablet (10 mg total) by mouth daily. 90 tablet 1   traMADol (ULTRAM) 50 MG tablet TAKE 1 TABLET BY MOUTH EVERY 6 HOURS AS NEEDED FOR MODERATE TO SEVERE PAIN 20 tablet 0   VITAMIN D PO Take by mouth.     No current facility-administered medications on file prior to visit.   Past Medical History:  Diagnosis Date   Arthritis    Complication of anesthesia    History of kidney stones    IN THE LEFT KIDNEY   PONV (postoperative nausea and vomiting)    JUST WITH 2 SURGERIES   Past Surgical History:  Procedure Laterality Date   ABDOMINAL HYSTERECTOMY     BRAIN SURGERY     MENGIOMA IN 2005   EYE SURGERY     BIL CATARACTS  LAMINECTOMY  03/31/2018    No family history on file. Social History   Socioeconomic History   Marital status: Married    Spouse name: Not on file   Number of children: Not on file   Years of education: Not on file   Highest education level: Not on file  Occupational History   Not on file  Tobacco Use   Smoking status: Former    Current packs/day: 0.00    Types: Cigarettes    Quit date: 57    Years since quitting: 44.2   Smokeless tobacco: Never  Substance and Sexual Activity   Alcohol use: Yes    Comment: OCCASIONAL GLASS OF WINE (ONCE A MONTH)   Drug use: Never   Sexual activity: Not on file  Other Topics Concern   Not on file  Social History Narrative   Not on file   Social Drivers of Health   Financial Resource Strain: Not on file  Food Insecurity: Not on file  Transportation Needs: Not on file  Physical Activity: Not on  file  Stress: Not on file  Social Connections: Not on file    Objective:  There were no vitals taken for this visit.     08/04/2021   11:31 AM 07/11/2020    2:38 PM 02/04/2020    7:51 AM  BP/Weight  Systolic BP 124 140 143  Diastolic BP 72 70 72  Wt. (Lbs) 161 164.6 155  BMI 26.79 kg/m2 27.39 kg/m2 25.79 kg/m2    Physical Exam  Diabetic Foot Exam - Simple   No data filed      Lab Results  Component Value Date   WBC 6.7 08/04/2021   HGB 14.2 08/04/2021   HCT 41.0 08/04/2021   PLT 201 08/04/2021   GLUCOSE 89 08/04/2021   CHOL 236 (H) 08/04/2021   TRIG 128 08/04/2021   HDL 60 08/04/2021   LDLCALC 153 (H) 08/04/2021   ALT 20 08/04/2021   AST 23 08/04/2021   NA 144 08/04/2021   K 4.4 08/04/2021   CL 105 08/04/2021   CREATININE 0.91 08/04/2021   BUN 17 08/04/2021   CO2 24 08/04/2021   TSH 1.060 08/04/2021      Assessment & Plan:  Assessment and Plan       Encounter for Medicare annual wellness exam     No orders of the defined types were placed in this encounter.   No orders of the defined types were placed in this encounter.    Follow-up: Return in 1 year (on 05/14/2024).   I,Javonn Gauger A Lucile Didonato,acting as a scribe for Blane Ohara, MD.,have documented all relevant documentation on the behalf of Blane Ohara, MD,as directed by  Blane Ohara, MD while in the presence of Blane Ohara, MD.   An After Visit Summary was printed and given to the patient.  Blane Ohara, MD [facility] [phone]

## 2023-05-15 NOTE — Assessment & Plan Note (Signed)
 Not well controlled Not currently taking medication prescribed for elevated cholesterol Lab Results  Component Value Date   LDLCALC 153 (H) 08/04/2021  -Continue to work on diet and exercise - labs drawn, Await labs/testing for assessment and recommendations

## 2023-05-15 NOTE — Assessment & Plan Note (Signed)
 Fluctuating blood pressure, higher at rest. Elevated during visit. Prefers non-medication approach due to variability. BP Readings from Last 3 Encounters:  05/15/23 (!) 148/82  08/04/21 124/72  07/11/20 140/70    - Recheck blood pressure during the visit.

## 2023-05-15 NOTE — Assessment & Plan Note (Signed)
 Chronic back pain post two surgeries, worsens with activity. Currently on Celecoxib, causing fatigue. Discussed switching to Meloxicam for potential relief without fatigue. - Prescribe Meloxicam 15 mg with instructions to halve the tablet if needed. - Advise against concurrent use of Meloxicam and Celecoxib. - Order nerve conduction test to assess for nerve-related issues.

## 2023-05-15 NOTE — Progress Notes (Signed)
 Subjective:  Patient ID: Claudia Dickerson, female    DOB: January 31, 1941  Age: 83 y.o. MRN: [id number]  Chief Complaint  Patient presents with   Medical Management of Chronic Issues    Discussed the use of AI scribe software for clinical note transcription with the patient, who gave verbal consent to proceed.  HPI Claudia Dickerson is an 83 year old female who presents for a chronic visit.  She has a history of a blood clot in the left leg, which was treated with medication until resolved. Despite this, she continues to experience swelling in the left leg, which she associates with the previous blood clot and broken ankle. The leg is more swollen than the right and sometimes swollen all the way up the leg. Previous ultrasounds on both legs were normal. No chest pain or shortness of breath.  She has a history of a broken ankle, which was treated, but later developed a torn tendon in the same foot. She declined surgery for the tendon and used compression garments, which initially helped but now the symptoms have worsened. She experiences redness and swelling in the foot, particularly in the morning, and describes the foot as feeling tight and swollen, though not painful.  She has undergone two back surgeries, the last being a decompression surgery in August of last year. Her back pain worsens with physical activity, such as using a weed eater or cutting shrubbery, but is not present today. The pain usually worsens the day after such activities. She takes Celebrex for back pain, which she does not take daily, and is considering trying meloxicam again.  She takes Allegra daily for allergies and occasionally uses Valium for sleep and muscle spasms. She is on multiple medications, including Celebrex and tramadol, and is seeking refills.   She has a history of blood pressure fluctuations, noting that her blood pressure is higher when resting and lower during physical activity. She does not take blood  pressure medication and monitors her blood pressure at home. No dizziness despite fluctuations in blood pressure.       05/15/2023    4:18 PM 08/04/2021   11:37 AM 07/11/2020    2:44 PM 05/02/2019   10:55 PM  Depression screen PHQ 2/9  Decreased Interest 0 0 0 0  Down, Depressed, Hopeless 0 0 0 0  PHQ - 2 Score 0 0 0 0  Altered sleeping 0     Tired, decreased energy 0     Change in appetite 0     Feeling bad or failure about yourself  0     Trouble concentrating 0     Moving slowly or fidgety/restless 0     Suicidal thoughts 0     PHQ-9 Score 0     Difficult doing work/chores Not difficult at all           05/15/2023    4:18 PM  Fall Risk   Falls in the past year? 1  Number falls in past yr: 1  Injury with Fall? 0  Risk for fall due to : No Fall Risks  Follow up Follow up appointment    Patient Care Team: Blane Ohara, MD as PCP - General (Family Medicine)   Review of Systems  Constitutional:  Negative for chills, diaphoresis, fatigue and fever.  HENT:  Negative for congestion, ear pain and sinus pain.   Respiratory:  Negative for cough and shortness of breath.   Cardiovascular:  Negative for chest pain.  Gastrointestinal:  Negative for abdominal pain, constipation, nausea and vomiting.  Genitourinary:  Negative for dysuria.  Musculoskeletal:  Negative for arthralgias.  Neurological:  Negative for weakness and headaches.  Psychiatric/Behavioral:  Negative for dysphoric mood. The patient is not nervous/anxious.     Current Outpatient Medications on File Prior to Visit  Medication Sig Dispense Refill   acetaminophen (TYLENOL) 500 MG tablet Take by mouth.     b complex vitamins tablet Take 1 tablet by mouth daily.     Biotin 1000 MCG CHEW Chew 1 each by mouth daily.     Calcium Carb-Cholecalciferol (CALCIUM 600 + D PO) Take 1 tablet by mouth daily.     celecoxib (CELEBREX) 100 MG capsule Take 100 mg by mouth daily.     Cholecalciferol (VITAMIN D3 PO) Take by mouth.      fexofenadine (ALLEGRA) 180 MG tablet Take 180 mg by mouth daily.     Lactobacillus Rhamnosus, GG, (RA PROBIOTIC DIGESTIVE CARE) CAPS Take 1 capsule by mouth daily.     magnesium gluconate (MAGONATE) 500 MG tablet Take by mouth.     Multiple Vitamin (MULTI-VITAMIN) tablet Take 1 tablet by mouth daily.     VITAMIN D PO Take by mouth.     Potassium Gluconate 2.5 MEQ TABS Take 1 tablet by mouth daily. (Patient not taking: Reported on 05/15/2023)     rosuvastatin (CRESTOR) 10 MG tablet Take 1 tablet (10 mg total) by mouth daily. (Patient not taking: Reported on 05/15/2023) 90 tablet 1   No current facility-administered medications on file prior to visit.   Past Medical History:  Diagnosis Date   Arthritis    Complication of anesthesia    History of kidney stones    IN THE LEFT KIDNEY   PONV (postoperative nausea and vomiting)    JUST WITH 2 SURGERIES   Past Surgical History:  Procedure Laterality Date   ABDOMINAL HYSTERECTOMY     BRAIN SURGERY     MENGIOMA IN 2005   EYE SURGERY     BIL CATARACTS   LAMINECTOMY  03/31/2018    History reviewed. No pertinent family history. Social History   Socioeconomic History   Marital status: Married    Spouse name: Not on file   Number of children: Not on file   Years of education: Not on file   Highest education level: Not on file  Occupational History   Not on file  Tobacco Use   Smoking status: Former    Current packs/day: 0.00    Types: Cigarettes    Quit date: 49    Years since quitting: 44.2   Smokeless tobacco: Never  Substance and Sexual Activity   Alcohol use: Yes    Comment: OCCASIONAL GLASS OF WINE (ONCE A MONTH)   Drug use: Never   Sexual activity: Not on file  Other Topics Concern   Not on file  Social History Narrative   Not on file   Social Drivers of Health   Financial Resource Strain: Not on file  Food Insecurity: Not on file  Transportation Needs: Not on file  Physical Activity: Not on file  Stress: Not on  file  Social Connections: Not on file    Objective:  BP (!) 148/82   Pulse 84   Temp 97.6 F (36.4 C) (Temporal)   Resp 16   Ht 5\' 5"  (1.651 m)   Wt 164 lb 6.4 oz (74.6 kg)   SpO2 99%   BMI 27.36 kg/m  05/15/2023    4:03 PM 08/04/2021   11:31 AM 07/11/2020    2:38 PM  BP/Weight  Systolic BP 148 124 140  Diastolic BP 82 72 70  Wt. (Lbs) 164.4 161 164.6  BMI 27.36 kg/m2 26.79 kg/m2 27.39 kg/m2    Physical Exam Constitutional:      General: She is not in acute distress.    Appearance: Normal appearance. She is not ill-appearing.  Eyes:     Conjunctiva/sclera: Conjunctivae normal.  Cardiovascular:     Rate and Rhythm: Normal rate and regular rhythm.     Heart sounds: Normal heart sounds. No murmur heard. Pulmonary:     Effort: Pulmonary effort is normal.     Breath sounds: Normal breath sounds. No wheezing.  Musculoskeletal:        General: Normal range of motion.     Cervical back: Normal range of motion.     Lumbar back: Tenderness present.     Left foot: Swelling present.  Skin:    General: Skin is warm.  Neurological:     Mental Status: She is alert. Mental status is at baseline.  Psychiatric:        Mood and Affect: Mood normal.        Behavior: Behavior normal.     Lab Results  Component Value Date   WBC 6.7 08/04/2021   HGB 14.2 08/04/2021   HCT 41.0 08/04/2021   PLT 201 08/04/2021   GLUCOSE 89 08/04/2021   CHOL 236 (H) 08/04/2021   TRIG 128 08/04/2021   HDL 60 08/04/2021   LDLCALC 153 (H) 08/04/2021   ALT 20 08/04/2021   AST 23 08/04/2021   NA 144 08/04/2021   K 4.4 08/04/2021   CL 105 08/04/2021   CREATININE 0.91 08/04/2021   BUN 17 08/04/2021   CO2 24 08/04/2021   TSH 1.060 08/04/2021      Assessment & Plan:   Mixed hyperlipidemia Assessment & Plan: Not well controlled Not currently taking medication prescribed for elevated cholesterol Lab Results  Component Value Date   LDLCALC 153 (H) 08/04/2021  -Continue to work on diet  and exercise - labs drawn, Await labs/testing for assessment and recommendations   Orders: -     Lipid panel -     CBC with Differential/Platelet -     Comprehensive metabolic panel with GFR -     TSH  Elevated BP without diagnosis of hypertension Assessment & Plan: Fluctuating blood pressure, higher at rest. Elevated during visit. Prefers non-medication approach due to variability. BP Readings from Last 3 Encounters:  05/15/23 (!) 148/82  08/04/21 124/72  07/11/20 140/70    - Recheck blood pressure during the visit.   Lumbar back pain Assessment & Plan: Chronic back pain post two surgeries, worsens with activity. Currently on Celecoxib, causing fatigue. Discussed switching to Meloxicam for potential relief without fatigue. - Prescribe Meloxicam 15 mg with instructions to halve the tablet if needed. - Advise against concurrent use of Meloxicam and Celecoxib. - Order nerve conduction test to assess for nerve-related issues.  Orders: -     Meloxicam; Take 1 tablet (15 mg total) by mouth daily.  Dispense: 30 tablet; Refill: 0 -     traMADol HCl; Take 1 tablet (50 mg total) by mouth every 6 (six) hours as needed for severe pain (pain score 7-10).  Dispense: 20 tablet; Refill: 0 -     diazePAM; Take 1 tablet (2 mg total) by mouth daily as needed for muscle spasms  or sedation.  Dispense: 30 tablet; Refill: 0  Acute left ankle pain Assessment & Plan: Swelling and redness in the left lower extremity with history of prior blood clot and ankle fracture. Ultrasound negative for current clots. Differential includes venous insufficiency, arthritis, neuropathy. Cardiac or renal issues considered. Nerve conduction test discussed. - Order nerve conduction test to evaluate for neuropathy. - Order labs to assess renal and cardiac function. - Advise wearing compression stockings.   Orders: -     Nerve conduction test; Future  Numbness of left lower extremity Assessment & Plan: Past ankle  injury with tendon involvement and DVT, treated and clear Ultrasound. Recent back surgery may be a contributing factor as well. -labs drawn -order nerve conduction studies to rule out possible neuropathy  Orders: -     Methylmalonic acid, serum -     B12 and Folate Panel -     Nerve conduction test; Future     Meds ordered this encounter  Medications   meloxicam (MOBIC) 15 MG tablet    Sig: Take 1 tablet (15 mg total) by mouth daily.    Dispense:  30 tablet    Refill:  0   traMADol (ULTRAM) 50 MG tablet    Sig: Take 1 tablet (50 mg total) by mouth every 6 (six) hours as needed for severe pain (pain score 7-10).    Dispense:  20 tablet    Refill:  0   diazepam (VALIUM) 2 MG tablet    Sig: Take 1 tablet (2 mg total) by mouth daily as needed for muscle spasms or sedation.    Dispense:  30 tablet    Refill:  0    Orders Placed This Encounter  Procedures   Lipid panel   CBC with Differential/Platelet   Comprehensive metabolic panel with GFR   Methylmalonic acid, serum   B12 and Folate Panel   TSH   Nerve conduction test     Follow-up: Return in 3 months (on 08/14/2023) for chronic.  An After Visit Summary was printed and given to the patient.  Total time spent on today's visit was 40 minutes, including both face-to-face time and nonface-to-face time personally spent on review of chart (labs and imaging), discussing labs and goals, discussing further work-up, treatment options, referrals to specialist if needed, reviewing outside records if pertinent, answering patient's questions, and coordinating care.   Lajuana Matte, FNP [facility] [phone]

## 2023-05-15 NOTE — Assessment & Plan Note (Signed)
 Past ankle injury with tendon involvement and DVT, treated and clear Ultrasound. Recent back surgery may be a contributing factor as well. -labs drawn -order nerve conduction studies to rule out possible neuropathy

## 2023-05-17 LAB — LIPID PANEL
Chol/HDL Ratio: 4.3 ratio (ref 0.0–4.4)
Cholesterol, Total: 234 mg/dL — ABNORMAL HIGH (ref 100–199)
HDL: 54 mg/dL (ref >39)
LDL Chol Calc (NIH): 157 mg/dL — ABNORMAL HIGH (ref 0–99)
Triglycerides: 130 mg/dL (ref 0–149)
VLDL Cholesterol Cal: 23 mg/dL (ref 5–40)

## 2023-05-17 LAB — CBC WITH DIFFERENTIAL/PLATELET
Basophils Absolute: 0 10*3/uL (ref 0.0–0.2)
Basos: 0 %
EOS (ABSOLUTE): 0.2 10*3/uL (ref 0.0–0.4)
Eos: 3 %
Hematocrit: 43.7 % (ref 34.0–46.6)
Hemoglobin: 14.6 g/dL (ref 11.1–15.9)
Immature Grans (Abs): 0 10*3/uL (ref 0.0–0.1)
Immature Granulocytes: 0 %
Lymphocytes Absolute: 2 10*3/uL (ref 0.7–3.1)
Lymphs: 31 %
MCH: 30.1 pg (ref 26.6–33.0)
MCHC: 33.4 g/dL (ref 31.5–35.7)
MCV: 90 fL (ref 79–97)
Monocytes Absolute: 0.7 10*3/uL (ref 0.1–0.9)
Monocytes: 11 %
Neutrophils Absolute: 3.4 10*3/uL (ref 1.4–7.0)
Neutrophils: 55 %
Platelets: 186 10*3/uL (ref 150–450)
RBC: 4.85 x10E6/uL (ref 3.77–5.28)
RDW: 12.5 % (ref 11.7–15.4)
WBC: 6.3 10*3/uL (ref 3.4–10.8)

## 2023-05-17 LAB — METHYLMALONIC ACID, SERUM: Methylmalonic Acid: 147 nmol/L (ref 0–378)

## 2023-05-17 LAB — COMPREHENSIVE METABOLIC PANEL WITH GFR
ALT: 15 IU/L (ref 0–32)
AST: 25 IU/L (ref 0–40)
Albumin: 4.5 g/dL (ref 3.7–4.7)
Alkaline Phosphatase: 122 IU/L — ABNORMAL HIGH (ref 44–121)
BUN/Creatinine Ratio: 21 (ref 12–28)
BUN: 17 mg/dL (ref 8–27)
Bilirubin Total: 0.5 mg/dL (ref 0.0–1.2)
CO2: 18 mmol/L — ABNORMAL LOW (ref 20–29)
Calcium: 9.4 mg/dL (ref 8.7–10.3)
Chloride: 102 mmol/L (ref 96–106)
Creatinine, Ser: 0.81 mg/dL (ref 0.57–1.00)
Globulin, Total: 2.4 g/dL (ref 1.5–4.5)
Glucose: 88 mg/dL (ref 70–99)
Potassium: 4.3 mmol/L (ref 3.5–5.2)
Sodium: 143 mmol/L (ref 134–144)
Total Protein: 6.9 g/dL (ref 6.0–8.5)
eGFR: 73 mL/min/{1.73_m2} (ref >59)

## 2023-05-17 LAB — B12 AND FOLATE PANEL
Folate: >20 ng/mL (ref >3.0)
Vitamin B-12: >2000 pg/mL — ABNORMAL HIGH (ref 232–1245)

## 2023-05-17 LAB — TSH: TSH: 1.54 u[IU]/mL (ref 0.450–4.500)

## 2023-05-18 ENCOUNTER — Other Ambulatory Visit: Payer: Self-pay | Admitting: Family Medicine

## 2023-05-18 MED ORDER — ROSUVASTATIN CALCIUM 20 MG PO TABS: 20.0000 mg | ORAL_TABLET | Freq: Every day | ORAL | 11 refills | Status: DC

## 2023-05-23 ENCOUNTER — Ambulatory Visit (INDEPENDENT_AMBULATORY_CARE_PROVIDER_SITE_OTHER): Payer: Self-pay

## 2023-05-23 VITALS — Ht 65.0 in | Wt 164.0 lb

## 2023-05-23 DIAGNOSIS — Z0000 Encounter for general adult medical examination without abnormal findings: Secondary | ICD-10-CM

## 2023-05-23 NOTE — Patient Instructions (Signed)
 Ms. Sigman , Thank you for taking time to come for your Medicare Wellness Visit. I appreciate your ongoing commitment to your health goals. Please review the following plan we discussed and let me know if I can assist you in the future.   Referrals/Orders/Follow-Ups/Clinician Recommendations: Aim for 30 minutes of exercise or brisk walking, 6-8 glasses of water, and 5 servings of fruits and vegetables each day.  This is a list of the screening recommended for you and due dates:  Health Maintenance  Topic Date Due   DTaP/Tdap/Td vaccine (1 - Tdap) Never done   Zoster (Shingles) Vaccine (1 of 2) Never done   COVID-19 Vaccine (3 - 2024-25 season) 10/07/2022   Flu Shot  09/06/2023   Medicare Annual Wellness Visit  05/22/2024   Pneumonia Vaccine  Completed   DEXA scan (bone density measurement)  Completed   HPV Vaccine  Aged Out   Meningitis B Vaccine  Aged Out    Advanced directives: (ACP Link)Information on Advanced Care Planning can be found at [place]  Secretary of [facility] Advance Health Care Directives Advance Health Care Directives. [email or web address]   Next Medicare Annual Wellness Visit scheduled for next year: Yes

## 2023-05-23 NOTE — Progress Notes (Signed)
 Subjective:   Claudia Dickerson is a 83 y.o. who presents for a Medicare Wellness preventive visit.  Visit Complete: Virtual I connected with  Tanya Nones on 05/23/23 by a audio enabled telemedicine application and verified that I am speaking with the correct person using two identifiers.  Patient Location: Home  Provider Location: Home Office  I discussed the limitations of evaluation and management by telemedicine. The patient expressed understanding and agreed to proceed.  Vital Signs: Because this visit was a virtual/telehealth visit, some criteria may be missing or patient reported. Any vitals not documented were not able to be obtained and vitals that have been documented are patient reported.  VideoDeclined- This patient declined Librarian, academic. Therefore the visit was completed with audio only.  Persons Participating in Visit: Patient.  AWV Questionnaire: No: Patient Medicare AWV questionnaire was not completed prior to this visit.  Cardiac Risk Factors include: advanced age (>59men, >49 women);dyslipidemia     Objective:    Today's Vitals   05/23/23 1613  Weight: 164 lb (74.4 kg)  Height: 5\' 5"  (1.651 m)   Body mass index is 27.29 kg/m.     05/23/2023    4:22 PM 04/03/2018   10:00 AM 03/31/2018    4:16 PM 03/21/2018    9:13 AM  Advanced Directives  Does Patient Have a Medical Advance Directive? No Yes Yes Yes  Type of Advance Directive   Living will   Does patient want to make changes to medical advance directive?   No - Patient declined   Would patient like information on creating a medical advance directive? Yes (MAU/Ambulatory/Procedural Areas - Information given)       Current Medications (verified) Outpatient Encounter Medications as of 05/23/2023  Medication Sig   acetaminophen (TYLENOL) 500 MG tablet Take by mouth.   b complex vitamins tablet Take 1 tablet by mouth daily.   Biotin 1000 MCG CHEW Chew 1 each by mouth  daily.   Calcium Carb-Cholecalciferol (CALCIUM 600 + D PO) Take 1 tablet by mouth daily.   celecoxib (CELEBREX) 100 MG capsule Take 100 mg by mouth daily.   diazepam (VALIUM) 2 MG tablet Take 1 tablet (2 mg total) by mouth daily as needed for muscle spasms or sedation.   fexofenadine (ALLEGRA) 180 MG tablet Take 180 mg by mouth daily.   Lactobacillus Rhamnosus, GG, (RA PROBIOTIC DIGESTIVE CARE) CAPS Take 1 capsule by mouth daily.   magnesium gluconate (MAGONATE) 500 MG tablet Take by mouth.   meloxicam (MOBIC) 15 MG tablet Take 1 tablet (15 mg total) by mouth daily.   traMADol (ULTRAM) 50 MG tablet Take 1 tablet (50 mg total) by mouth every 6 (six) hours as needed for severe pain (pain score 7-10).   VITAMIN D PO Take by mouth.   Potassium Gluconate 2.5 MEQ TABS Take 1 tablet by mouth daily. (Patient not taking: Reported on 05/23/2023)   rosuvastatin (CRESTOR) 20 MG tablet Take 1 tablet (20 mg total) by mouth daily. (Patient not taking: Reported on 05/23/2023)   [DISCONTINUED] Cholecalciferol (VITAMIN D3 PO) Take by mouth.   [DISCONTINUED] Multiple Vitamin (MULTI-VITAMIN) tablet Take 1 tablet by mouth daily.   No facility-administered encounter medications on file as of 05/23/2023.    Allergies (verified) Amoxicillin, Trimethoprim, and Xarelto [rivaroxaban]   History: Past Medical History:  Diagnosis Date   Arthritis    Complication of anesthesia    History of kidney stones    IN THE LEFT KIDNEY  PONV (postoperative nausea and vomiting)    JUST WITH 2 SURGERIES   Past Surgical History:  Procedure Laterality Date   ABDOMINAL HYSTERECTOMY     BRAIN SURGERY     MENGIOMA IN 2005   EYE SURGERY     BIL CATARACTS   LAMINECTOMY  03/31/2018   History reviewed. No pertinent family history. Social History   Socioeconomic History   Marital status: Married    Spouse name: Not on file   Number of children: Not on file   Years of education: Not on file   Highest education level: Not on  file  Occupational History   Not on file  Tobacco Use   Smoking status: Former    Current packs/day: 0.00    Types: Cigarettes    Quit date: 78    Years since quitting: 44.3   Smokeless tobacco: Never  Substance and Sexual Activity   Alcohol use: Yes    Comment: OCCASIONAL GLASS OF WINE (ONCE A MONTH)   Drug use: Never   Sexual activity: Not on file  Other Topics Concern   Not on file  Social History Narrative   Not on file   Social Drivers of Health   Financial Resource Strain: Low Risk  (05/23/2023)   Overall Financial Resource Strain (CARDIA)    Difficulty of Paying Living Expenses: Not hard at all  Food Insecurity: No Food Insecurity (05/23/2023)   Hunger Vital Sign    Worried About Running Out of Food in the Last Year: Never true    Ran Out of Food in the Last Year: Never true  Transportation Needs: No Transportation Needs (05/23/2023)   PRAPARE - Administrator, Civil Service (Medical): No    Lack of Transportation (Non-Medical): No  Physical Activity: Insufficiently Active (05/23/2023)   Exercise Vital Sign    Days of Exercise per Week: 3 days    Minutes of Exercise per Session: 30 min  Stress: No Stress Concern Present (05/23/2023)   [employer] of Occupational Health - Occupational Stress Questionnaire    Feeling of Stress : Not at all  Social Connections: Socially Integrated (05/23/2023)   Social Connection and Isolation Panel [NHANES]    Frequency of Communication with Friends and Family: More than three times a week    Frequency of Social Gatherings with Friends and Family: Three times a week    Attends Religious Services: More than 4 times per year    Active Member of Clubs or Organizations: Yes    Attends Banker Meetings: 1 to 4 times per year    Marital Status: Married    Tobacco Counseling Counseling given: Not Answered    Clinical Intake:  Pre-visit preparation completed: Yes  Pain : No/denies pain      Diabetes: No  No results found for: "HGBA1C"   How often do you need to have someone help you when you read instructions, pamphlets, or other written materials from your doctor or pharmacy?: 1 - Never  Interpreter Needed?: No  Information entered by :: Kandis Fantasia LPN   Activities of Daily Living     05/23/2023    4:22 PM  In your present state of health, do you have any difficulty performing the following activities:  Hearing? 0  Vision? 0  Difficulty concentrating or making decisions? 0  Walking or climbing stairs? 0  Dressing or bathing? 0  Doing errands, shopping? 0  Preparing Food and eating ? N  Using the Toilet? N  In the past six months, have you accidently leaked urine? N  Do you have problems with loss of bowel control? N  Managing your Medications? N  Managing your Finances? N  Housekeeping or managing your Housekeeping? N    Patient Care Team: CoxFritzi Mandes, MD as PCP - General (Family Medicine)  Indicate any recent Medical Services you may have received from other than [facility] providers in the past year (date may be approximate).     Assessment:   This is a routine wellness examination for Claudia Dickerson.  Hearing/Vision screen Hearing Screening - Comments:: Denies hearing difficulties   Vision Screening - Comments:: No vision problems; will schedule routine eye exam soon     Goals Addressed             This Visit's Progress    Remain active and independent         Depression Screen     05/23/2023    4:21 PM 05/15/2023    4:18 PM 08/04/2021   11:37 AM 07/11/2020    2:44 PM 05/02/2019   10:55 PM  PHQ 2/9 Scores  PHQ - 2 Score 0 0 0 0 0  PHQ- 9 Score 0 0       Fall Risk     05/23/2023    4:22 PM 05/15/2023    4:18 PM 08/04/2021   11:37 AM 07/11/2020    3:42 PM 07/11/2020    2:44 PM  Fall Risk   Falls in the past year? 1 1 1   0  Number falls in past yr: 1 1 0  0  Injury with Fall? 0 0 1  0  Risk for fall due to : History of fall(s);Impaired  balance/gait;Impaired mobility No Fall Risks History of fall(s) No Fall Risks   Follow up Education provided;Falls prevention discussed;Falls evaluation completed Follow up appointment Falls evaluation completed Falls evaluation completed     MEDICARE RISK AT HOME:  Medicare Risk at Home Any stairs in or around the home?: No If so, are there any without handrails?: No Home free of loose throw rugs in walkways, pet beds, electrical cords, etc?: Yes Adequate lighting in your home to reduce risk of falls?: Yes Life alert?: No Use of a cane, walker or w/c?: No Grab bars in the bathroom?: Yes Shower chair or bench in shower?: No Elevated toilet seat or a handicapped toilet?: Yes  TIMED UP AND GO:  Was the test performed?  No  Cognitive Function: 6CIT completed        05/23/2023    4:22 PM  6CIT Screen  What Year? 0 points  What month? 0 points  What time? 0 points  Count back from 20 0 points  Months in reverse 0 points  Repeat phrase 0 points  Total Score 0 points    Immunizations Immunization History  Administered Date(s) Administered   PFIZER(Purple Top)SARS-COV-2 Vaccination 04/06/2019, 04/27/2019   Pneumococcal Conjugate-13 07/11/2020   Pneumococcal Polysaccharide-23 10/14/2018    Screening Tests Health Maintenance  Topic Date Due   DTaP/Tdap/Td (1 - Tdap) Never done   Zoster Vaccines- Shingrix (1 of 2) Never done   COVID-19 Vaccine (3 - 2024-25 season) 10/07/2022   INFLUENZA VACCINE  09/06/2023   Medicare Annual Wellness (AWV)  05/22/2024   Pneumonia Vaccine 16+ Years old  Completed   DEXA SCAN  Completed   HPV VACCINES  Aged Out   Meningococcal B Vaccine  Aged Out    Health Maintenance  Health Maintenance Due  Topic Date Due   DTaP/Tdap/Td (1 - Tdap) Never done   Zoster Vaccines- Shingrix (1 of 2) Never done   COVID-19 Vaccine (3 - 2024-25 season) 10/07/2022    Additional Screening:  Vision Screening: Recommended annual ophthalmology exams for  early detection of glaucoma and other disorders of the eye.  Dental Screening: Recommended annual dental exams for proper oral hygiene  Community Resource Referral / Chronic Care Management: CRR required this visit?  No   CCM required this visit?  No     Plan:     I have personally reviewed and noted the following in the patient's chart:   Medical and social history Use of alcohol, tobacco or illicit drugs  Current medications and supplements including opioid prescriptions. Patient is not currently taking opioid prescriptions. Functional ability and status Nutritional status Physical activity Advanced directives List of other physicians Hospitalizations, surgeries, and ER visits in previous 12 months Vitals Screenings to include cognitive, depression, and falls Referrals and appointments  In addition, I have reviewed and discussed with patient certain preventive protocols, quality metrics, and best practice recommendations. A written personalized care plan for preventive services as well as general preventive health recommendations were provided to patient.     [place], [place]   0/11/9321   After Visit Summary: (MyChart) Due to this being a telephonic visit, the after visit summary with patients personalized plan was offered to patient via MyChart   Notes:  Patient has decided that she would like to not take any medications for cholesterol at this time

## 2023-06-03 ENCOUNTER — Telehealth: Payer: Self-pay

## 2023-06-03 NOTE — Telephone Encounter (Signed)
 Copied from CRM [id number]. Topic: Clinical - Request for Lab/Test Order >> Jun 03, 2023  4:30 PM Leory Rands wrote: Reason for CRM: Patient is calling to check on the status for the order for Order nerve conduction test to evaluate for neuropathy.

## 2023-06-10 ENCOUNTER — Other Ambulatory Visit: Payer: Self-pay | Admitting: Family Medicine

## 2023-06-10 DIAGNOSIS — M545 Low back pain, unspecified: Secondary | ICD-10-CM

## 2023-06-12 NOTE — Telephone Encounter (Signed)
 Spoke with [facility] and they will reach out to the Pt today to schedule.

## 2023-07-22 DIAGNOSIS — M5417 Radiculopathy, lumbosacral region: Secondary | ICD-10-CM | POA: Diagnosis not present

## 2023-09-16 ENCOUNTER — Ambulatory Visit (INDEPENDENT_AMBULATORY_CARE_PROVIDER_SITE_OTHER): Admitting: Family Medicine

## 2023-09-16 ENCOUNTER — Encounter: Payer: Self-pay | Admitting: Family Medicine

## 2023-09-16 VITALS — BP 130/72 | HR 74 | Temp 97.8°F | Ht 65.0 in | Wt 164.0 lb

## 2023-09-16 DIAGNOSIS — M7989 Other specified soft tissue disorders: Secondary | ICD-10-CM | POA: Insufficient documentation

## 2023-09-16 DIAGNOSIS — R7989 Other specified abnormal findings of blood chemistry: Secondary | ICD-10-CM | POA: Diagnosis not present

## 2023-09-16 DIAGNOSIS — E782 Mixed hyperlipidemia: Secondary | ICD-10-CM

## 2023-09-16 DIAGNOSIS — M545 Low back pain, unspecified: Secondary | ICD-10-CM

## 2023-09-16 NOTE — Assessment & Plan Note (Signed)
 Not well controlled Not currently taking medication prescribed for elevated cholesterol Last lipids Lab Results  Component Value Date   CHOL 234 (H) 05/15/2023   HDL 54 05/15/2023   LDLCALC 157 (H) 05/15/2023   TRIG 130 05/15/2023   CHOLHDL 4.3 05/15/2023    - Continue to work on diet and exercise - labs drawn, Await labs/testing for assessment and recommendations - Recommend that she have a CT calcium  score to determine the risk of cardiovascular disease

## 2023-09-16 NOTE — Assessment & Plan Note (Signed)
 Improved since last visit, redness and swelling still present. Denies pain or warmth.Differentials include venous insufficiency or cellulitis.  No fever, vital signs stable. No warmth or tenderness at the sight.  - US  previously done to rule out DVT - nerve conduction test completed FU if signs and symptoms of infection develop

## 2023-09-16 NOTE — Progress Notes (Signed)
 Subjective:  Patient ID: Claudia Dickerson Hence, female    DOB: 1940/05/12  Age: 83 y.o. MRN: [id number]  Chief Complaint  Patient presents with   Medical Management of Chronic Issues    Mixed Hyperlipidemia    Discussed the use of AI scribe software for clinical note transcription with the patient, who gave verbal consent to proceed.  History of Present Illness   Claudia Dickerson is an 83 year old female who presents for follow-up of elevated B12 levels and monitoring of kidney function due to NSAID use.  Dyslipidemia: Patient not currently taking any medications for elevated cholesterol. She reports having leg cramps as a side effect of the statin she was on. She does not like omega 3 fish oil due to the size and she states that her cholesterol is always elevated and that she doesn't feel the need to take anything. She does maintain a healthy diet.   Elevated B12: She takes a B12 supplement once or twice a week, and her B12 levels will be rechecked. There is no mention of any symptoms related to elevated B12 levels.  Chronic back pain: She is currently taking NSAIDs, including meloxicam  and Celebrex, and alternates with tramadol . She has a history of back issues, having undergone two back surgeries: the first was a fusion and the second a decompression. No chest pain, shortness of breath, nausea, vomiting, diarrhea, constipation, trouble sleeping, or headaches.    Left leg swelling: Patient reports her left lower leg swelling has improved but still has some redness as mentioned before. She had a nerve conduction test on 07/22/23 that showed.  No evidence of a classic peripheral neuropathy in the left lower extremity limb.  No evidence of a common peroneal motor or tibial motor entrapment neuropathy in the left lower limb. Radiculopathy, lumbosacral region.  She reports an ultrasound was negative for blood clot.      09/16/2023    8:28 AM 05/23/2023    4:21 PM 05/15/2023    4:18 PM 08/04/2021    11:37 AM 07/11/2020    2:44 PM  Depression screen PHQ 2/9  Decreased Interest 0 0 0 0 0  Down, Depressed, Hopeless 0 0 0 0 0  PHQ - 2 Score 0 0 0 0 0  Altered sleeping 0 0 0    Tired, decreased energy 0 0 0    Change in appetite 0 0 0    Feeling bad or failure about yourself  0 0 0    Trouble concentrating 0 0 0    Moving slowly or fidgety/restless 0 0 0    Suicidal thoughts 0 0 0    PHQ-9 Score 0 0 0    Difficult doing work/chores Not difficult at all Not difficult at all Not difficult at all          09/16/2023    8:28 AM  Fall Risk   Falls in the past year? 0  Number falls in past yr: 0  Injury with Fall? 0  Risk for fall due to : No Fall Risks  Follow up Falls evaluation completed    Patient Care Team: Teressa Harrie HERO, FNP as PCP - General (Family Medicine)   Review of Systems  Constitutional:  Negative for chills, diaphoresis, fatigue and fever.  HENT:  Negative for congestion, ear pain and sinus pain.   Eyes: Negative.   Respiratory:  Negative for cough and shortness of breath.   Cardiovascular:  Negative for chest pain.  Gastrointestinal:  Negative for abdominal pain, constipation, diarrhea, nausea and vomiting.  Endocrine: Negative.   Genitourinary:  Negative for dysuria, frequency and urgency.  Musculoskeletal:  Negative for arthralgias.  Skin:  Positive for color change (left lower leg).  Allergic/Immunologic: Negative.   Neurological:  Negative for dizziness, weakness, light-headedness and headaches.  Hematological: Negative.   Psychiatric/Behavioral:  Negative for dysphoric mood. The patient is not nervous/anxious.     Current Outpatient Medications on File Prior to Visit  Medication Sig Dispense Refill   acetaminophen  (TYLENOL ) 500 MG tablet Take by mouth.     b complex vitamins tablet Take 1 tablet by mouth daily.     Biotin 1000 MCG CHEW Chew 1 each by mouth daily.     Calcium  Carb-Cholecalciferol (CALCIUM  600 + D PO) Take 1 tablet by mouth daily.      celecoxib (CELEBREX) 100 MG capsule Take 100 mg by mouth daily. (Patient taking differently: Take 100 mg by mouth daily as needed. Patient alternates between meloxicam  and celebrex)     diazepam  (VALIUM ) 2 MG tablet Take 1 tablet (2 mg total) by mouth daily as needed for muscle spasms or sedation. 30 tablet 0   fexofenadine (ALLEGRA) 180 MG tablet Take 180 mg by mouth daily.     Lactobacillus Rhamnosus, GG, (RA PROBIOTIC DIGESTIVE CARE) CAPS Take 1 capsule by mouth daily. (Patient taking differently: Take 1 capsule by mouth daily as needed.)     magnesium  gluconate (MAGONATE) 500 MG tablet Take by mouth. (Patient taking differently: Take 500 mg by mouth daily as needed.)     meloxicam  (MOBIC ) 15 MG tablet Take 1 tablet by mouth once daily (Patient taking differently: Take 15 mg by mouth daily as needed for pain.) 30 tablet 3   traMADol  (ULTRAM ) 50 MG tablet Take 1 tablet (50 mg total) by mouth every 6 (six) hours as needed for severe pain (pain score 7-10). 20 tablet 0   VITAMIN D PO Take by mouth.     Potassium Gluconate 2.5 MEQ TABS Take 1 tablet by mouth daily. (Patient not taking: Reported on 09/16/2023)     No current facility-administered medications on file prior to visit.   Past Medical History:  Diagnosis Date   Arthritis    Complication of anesthesia    History of kidney stones    IN THE LEFT KIDNEY   PONV (postoperative nausea and vomiting)    JUST WITH 2 SURGERIES   Past Surgical History:  Procedure Laterality Date   ABDOMINAL HYSTERECTOMY     BRAIN SURGERY     MENGIOMA IN 2005   EYE SURGERY     BIL CATARACTS   LAMINECTOMY  03/31/2018    History reviewed. No pertinent family history. Social History   Socioeconomic History   Marital status: Married    Spouse name: Not on file   Number of children: Not on file   Years of education: Not on file   Highest education level: Not on file  Occupational History   Not on file  Tobacco Use   Smoking status: Former    Current  packs/day: 0.00    Types: Cigarettes    Quit date: 60    Years since quitting: 44.6   Smokeless tobacco: Never  Substance and Sexual Activity   Alcohol  use: Yes    Comment: OCCASIONAL GLASS OF WINE (ONCE A MONTH)   Drug use: Never   Sexual activity: Not on file  Other Topics Concern   Not on file  Social History  Narrative   Not on file   Social Drivers of Health   Financial Resource Strain: Low Risk  (05/23/2023)   Overall Financial Resource Strain (CARDIA)    Difficulty of Paying Living Expenses: Not hard at all  Food Insecurity: No Food Insecurity (05/23/2023)   Hunger Vital Sign    Worried About Running Out of Food in the Last Year: Never true    Ran Out of Food in the Last Year: Never true  Transportation Needs: No Transportation Needs (05/23/2023)   PRAPARE - Administrator, Civil Service (Medical): No    Lack of Transportation (Non-Medical): No  Physical Activity: Insufficiently Active (05/23/2023)   Exercise Vital Sign    Days of Exercise per Week: 3 days    Minutes of Exercise per Session: 30 min  Stress: No Stress Concern Present (05/23/2023)   [employer] of Occupational Health - Occupational Stress Questionnaire    Feeling of Stress : Not at all  Social Connections: Socially Integrated (05/23/2023)   Social Connection and Isolation Panel    Frequency of Communication with Friends and Family: More than three times a week    Frequency of Social Gatherings with Friends and Family: Three times a week    Attends Religious Services: More than 4 times per year    Active Member of Clubs or Organizations: Yes    Attends Banker Meetings: 1 to 4 times per year    Marital Status: Married    Objective:  BP 130/72   Pulse 74   Temp 97.8 F (36.6 C) (Temporal)   Ht 5' 5 (1.651 m)   Wt 164 lb (74.4 kg)   SpO2 99%   BMI 27.29 kg/m      09/16/2023    8:17 AM 05/23/2023    4:13 PM 05/15/2023    4:03 PM  BP/Weight  Systolic BP 130 -- 148   Diastolic BP 72 -- 82  Wt. (Lbs) 164 164 164.4  BMI 27.29 kg/m2 27.29 kg/m2 27.36 kg/m2    Physical Exam Vitals reviewed.  Constitutional:      General: She is not in acute distress.    Appearance: Normal appearance.  Eyes:     Conjunctiva/sclera: Conjunctivae normal.  Cardiovascular:     Rate and Rhythm: Normal rate and regular rhythm.     Heart sounds: Normal heart sounds. No murmur heard. Pulmonary:     Effort: Pulmonary effort is normal.     Breath sounds: Normal breath sounds. No wheezing.  Abdominal:     Palpations: Abdomen is soft.  Musculoskeletal:        General: Normal range of motion.  Skin:    General: Skin is warm.     Findings: Erythema (left lower extremity) present.  Neurological:     Mental Status: She is alert. Mental status is at baseline.  Psychiatric:        Mood and Affect: Mood normal.        Behavior: Behavior normal.     Lab Results  Component Value Date   WBC 6.3 05/15/2023   HGB 14.6 05/15/2023   HCT 43.7 05/15/2023   PLT 186 05/15/2023   GLUCOSE 88 05/15/2023   CHOL 234 (H) 05/15/2023   TRIG 130 05/15/2023   HDL 54 05/15/2023   LDLCALC 157 (H) 05/15/2023   ALT 15 05/15/2023   AST 25 05/15/2023   NA 143 05/15/2023   K 4.3 05/15/2023   CL 102 05/15/2023   CREATININE 0.81 05/15/2023  BUN 17 05/15/2023   CO2 18 (L) 05/15/2023   TSH 1.540 05/15/2023      Assessment & Plan:  Mixed hyperlipidemia Assessment & Plan: Not well controlled Not currently taking medication prescribed for elevated cholesterol Last lipids Lab Results  Component Value Date   CHOL 234 (H) 05/15/2023   HDL 54 05/15/2023   LDLCALC 157 (H) 05/15/2023   TRIG 130 05/15/2023   CHOLHDL 4.3 05/15/2023    - Continue to work on diet and exercise - labs drawn, Await labs/testing for assessment and recommendations - Recommend that she have a CT calcium  score to determine the risk of cardiovascular disease    Orders: -     CBC with Differential/Platelet -      CMP14+EGFR -     Lipid panel  Elevated vitamin B12 level Assessment & Plan: Elevated vitamin B12 level Previously elevated B12 level with current supplementation once or twice a week. - Recheck B12 level, Await labs/testing for assessment and recommendations   Orders: -     Vitamin B12  Lumbar back pain Assessment & Plan: Chronic back pain post two surgeries, worsens with activity. Currently on Celecoxib, causing fatigue. Discussed switching to Meloxicam  for potential relief without fatigue. - Prescribe Meloxicam  15 mg with instructions to halve the tablet if needed. - Advise against concurrent use of Meloxicam  and Celecoxib. - nerve conduction test showed  No evidence of a classic peripheral neuropathy in the left lower extremity limb.  No evidence of a common peroneal motor or tibial motor entrapment neuropathy in the left lower limb. Radiculopathy, lumbosacral region.  - labs drawn to monitor kidney function   Left leg swelling Assessment & Plan: Improved since last visit, redness and swelling still present. Denies pain or warmth.Differentials include venous insufficiency or cellulitis.  No fever, vital signs stable. No warmth or tenderness at the sight.  - US  previously done to rule out DVT - nerve conduction test completed FU if signs and symptoms of infection develop      No orders of the defined types were placed in this encounter.   Orders Placed This Encounter  Procedures   CBC with Differential/Platelet   CMP14+EGFR   Lipid panel   Vitamin B12        Follow-up: Return in about 6 months (around 03/18/2024) for chronic.   I,Angela Taylor,acting as a Neurosurgeon for Harrie CHRISTELLA Cedar, FNP.,have documented all relevant documentation on the behalf of Harrie CHRISTELLA Cedar, FNP,as directed by  Harrie CHRISTELLA Cedar, FNP while in the presence of Harrie CHRISTELLA Cedar, FNP.   An After Visit Summary was printed and given to the patient.  I attest that I have reviewed this visit and agree  with the plan scribed by my staff.   Harrie CHRISTELLA Cedar, FNP [facility] [phone]

## 2023-09-16 NOTE — Patient Instructions (Addendum)
 Located in: [facility] Address: [address] [place], [place], [place] [address] Phone: [phone] Hours:  Open ? Closes 12?PM ? Reopens 1?PM    VISIT SUMMARY: Today, we discussed your elevated B12 levels and the impact of your chronic NSAID use on your kidney function. We reviewed your current medications and your history of back surgeries.  YOUR PLAN: -CHRONIC NSAID USE: Chronic use of NSAIDs, such as meloxicam  and Celebrex, can affect your kidney function. We will monitor your kidney and liver function regularly to ensure they remain healthy.  - DYSLIPIDEMIA: Continue to work on diet and exercise. Recommend starting omega 3 fish oil 1000 mg by mouth TWICE A DAY and red rice yeast supplements. Continue to work on diet and exercise. Recommend also getting a CT calcium  score based on your lab results.   -ELEVATED VITAMIN B12 LEVEL: Your B12 levels were previously elevated, likely due to your supplementation. We will recheck your B12 level at [facility] in [place] to ensure it is within a normal range.  INSTRUCTIONS: Please go to [facility] in [facility] to have your B12 level rechecked. Continue taking your medications as prescribed, and we will monitor your kidney and liver function regularly.                      Contains text generated by [employer].

## 2023-09-16 NOTE — Assessment & Plan Note (Signed)
 Chronic back pain post two surgeries, worsens with activity. Currently on Celecoxib, causing fatigue. Discussed switching to Meloxicam  for potential relief without fatigue. - Prescribe Meloxicam  15 mg with instructions to halve the tablet if needed. - Advise against concurrent use of Meloxicam  and Celecoxib. - nerve conduction test showed  No evidence of a classic peripheral neuropathy in the left lower extremity limb.  No evidence of a common peroneal motor or tibial motor entrapment neuropathy in the left lower limb. Radiculopathy, lumbosacral region.  - labs drawn to monitor kidney function

## 2023-09-16 NOTE — Assessment & Plan Note (Signed)
 Elevated vitamin B12 level Previously elevated B12 level with current supplementation once or twice a week. - Recheck B12 level, Await labs/testing for assessment and recommendations

## 2023-09-19 DIAGNOSIS — R7989 Other specified abnormal findings of blood chemistry: Secondary | ICD-10-CM | POA: Diagnosis not present

## 2023-09-19 DIAGNOSIS — E782 Mixed hyperlipidemia: Secondary | ICD-10-CM | POA: Diagnosis not present

## 2023-09-20 ENCOUNTER — Ambulatory Visit: Payer: Self-pay | Admitting: Family Medicine

## 2023-09-20 LAB — CMP14+EGFR
ALT: 15 IU/L (ref 0–32)
AST: 21 IU/L (ref 0–40)
Albumin: 4.4 g/dL (ref 3.7–4.7)
Alkaline Phosphatase: 100 IU/L (ref 44–121)
BUN/Creatinine Ratio: 22 (ref 12–28)
BUN: 18 mg/dL (ref 8–27)
Bilirubin Total: 0.3 mg/dL (ref 0.0–1.2)
CO2: 24 mmol/L (ref 20–29)
Calcium: 9.5 mg/dL (ref 8.7–10.3)
Chloride: 102 mmol/L (ref 96–106)
Creatinine, Ser: 0.82 mg/dL (ref 0.57–1.00)
Globulin, Total: 2.3 g/dL (ref 1.5–4.5)
Glucose: 80 mg/dL (ref 70–99)
Potassium: 4.2 mmol/L (ref 3.5–5.2)
Sodium: 140 mmol/L (ref 134–144)
Total Protein: 6.7 g/dL (ref 6.0–8.5)
eGFR: 72 mL/min/1.73 (ref >59)

## 2023-09-20 LAB — CBC WITH DIFFERENTIAL/PLATELET
Basophils Absolute: 0 x10E3/uL (ref 0.0–0.2)
Basos: 0 %
EOS (ABSOLUTE): 0.3 x10E3/uL (ref 0.0–0.4)
Eos: 4 %
Hematocrit: 41.9 % (ref 34.0–46.6)
Hemoglobin: 14 g/dL (ref 11.1–15.9)
Immature Grans (Abs): 0 x10E3/uL (ref 0.0–0.1)
Immature Granulocytes: 0 %
Lymphocytes Absolute: 2.6 x10E3/uL (ref 0.7–3.1)
Lymphs: 39 %
MCH: 31.7 pg (ref 26.6–33.0)
MCHC: 33.4 g/dL (ref 31.5–35.7)
MCV: 95 fL (ref 79–97)
Monocytes Absolute: 0.8 x10E3/uL (ref 0.1–0.9)
Monocytes: 12 %
Neutrophils Absolute: 3 x10E3/uL (ref 1.4–7.0)
Neutrophils: 45 %
Platelets: 197 x10E3/uL (ref 150–450)
RBC: 4.42 x10E6/uL (ref 3.77–5.28)
RDW: 13.1 % (ref 11.7–15.4)
WBC: 6.7 x10E3/uL (ref 3.4–10.8)

## 2023-09-20 LAB — LIPID PANEL
Chol/HDL Ratio: 4.7 ratio — ABNORMAL HIGH (ref 0.0–4.4)
Cholesterol, Total: 231 mg/dL — ABNORMAL HIGH (ref 100–199)
HDL: 49 mg/dL (ref >39)
LDL Chol Calc (NIH): 131 mg/dL — ABNORMAL HIGH (ref 0–99)
Triglycerides: 286 mg/dL — ABNORMAL HIGH (ref 0–149)
VLDL Cholesterol Cal: 51 mg/dL — ABNORMAL HIGH (ref 5–40)

## 2023-09-20 LAB — VITAMIN B12: Vitamin B-12: 1768 pg/mL — ABNORMAL HIGH (ref 232–1245)

## 2023-09-23 ENCOUNTER — Other Ambulatory Visit: Payer: Self-pay

## 2023-09-23 DIAGNOSIS — M545 Low back pain, unspecified: Secondary | ICD-10-CM

## 2023-09-24 MED ORDER — MELOXICAM 15 MG PO TABS: 15.0000 mg | ORAL_TABLET | Freq: Every day | ORAL | 3 refills | Status: AC

## 2023-10-05 DIAGNOSIS — N3001 Acute cystitis with hematuria: Secondary | ICD-10-CM | POA: Diagnosis not present

## 2023-10-05 DIAGNOSIS — R350 Frequency of micturition: Secondary | ICD-10-CM | POA: Diagnosis not present

## 2023-10-05 DIAGNOSIS — R030 Elevated blood-pressure reading, without diagnosis of hypertension: Secondary | ICD-10-CM | POA: Diagnosis not present

## 2023-10-23 DIAGNOSIS — Z6826 Body mass index (BMI) 26.0-26.9, adult: Secondary | ICD-10-CM | POA: Diagnosis not present

## 2023-10-23 DIAGNOSIS — M4316 Spondylolisthesis, lumbar region: Secondary | ICD-10-CM | POA: Diagnosis not present

## 2023-10-30 ENCOUNTER — Other Ambulatory Visit (HOSPITAL_COMMUNITY): Payer: Self-pay | Admitting: Neurological Surgery

## 2023-10-30 DIAGNOSIS — M4316 Spondylolisthesis, lumbar region: Secondary | ICD-10-CM

## 2023-10-31 NOTE — Progress Notes (Signed)
 PATIENT NAME: Claudia Dickerson, Claudia Dickerson PATIENT ID: [id number] DATE OF BIRTH: Dec 23, 1940 DATE OF VISIT: 08/01/2022 Patient MRN: [id number] Provider ID: [id number] Date: 08/01/2022 Description: Clinic Note Category: In Office Documents Ms. Lapaglia returns to the office today to discuss the results of an MRI that was recently performed. I had seen Claudia Dickerson back in October of last year. At that time, she had some physical therapy for her back and was having primarily back symptoms radiating to the glutei and also into the anterior thighs. She had a previous decompression and fusion L3-L5 that I did a number of years ago, and she had gotten along fairly well with that surgery. Plain x-rays that were performed at that time demonstrated that she overall had good health and function of her back. She did have some significant adjacent level disease not immediately above the fusion but 2 levels above at L1-L2 where she had some advanced disc degenerative changes with bone on bone at that level. We did a translaminar epidural injection at L1-L2, and she notes that this did not yield much improvement in her symptoms. Claudia Dickerson is seen now having had an MRI of the lumbar spine recently completed, and today, on review of that study, we note that the most notable feature in Claudia Dickerson's MRI is that she has adjacent level stenosis at L2-L3 secondary to a combination of hypertrophy of the ligament, some epidural lipomatosis, foraminal stenosis, and a broad-based disc bulge. This creates a moderately severe stenosis at the L2-3 level. I noted at L1-L2 that she has some degenerative changes and she has some biforaminal stenosis that the radiologist reads as severe. However, on my review of the films and discussion with the patient regarding her symptoms, I view the severity of the lateral recess stenosis at L1-L2 as mild to moderate at best, and clinically her symptoms do not correlate with those findings. I also note that  she has some degenerative changes at the T12-L1 level. Given the symptoms that Claudia Dickerson has been experiencing with pain down the anterior thighs, difficulty first getting up onto her feet and ambulating distances, I suspect the central canal stenosis at L2-L3 is creating the bulk of these symptoms. On examination today, I note that Claudia Dickerson demonstrates good strength in the iliopsoas, quad, tibialis anterior, and the gastroc. What is notable is that Claudia Dickerson tends to pitch forward about 10 degrees when she does try to stand straight and erect, and her daughter notes that she is typically pitched forward a bit more during normal activities during the day. I have advised that Claudia Dickerson would be best served with a minimally invasive decompression at the L2-L3 level using a unilateral approach to decompress the central canal and lateral recesses at that level. I noted that this would not require a fusion and typically would be an outpatient [facility]. I discussed with her the recovery would require some cautious movement on her part for about 4-6 weeks' time, but she would be able to be up and about on her own in the 1st few days from surgery. Generally, when patients require pain medication, they require it for about a week to 10 days, sometimes up to 2 weeks, but the pain is not as severe or substantial as what she would experience with her previous decompression and fusion L3-L5. I noted that based on the plain x-rays that I had from October of last year, there is no abnormal motion across the L2-L3 level, and the idea would be to simply do  the decompression and not destabilize that joint. I noted that I believe a substantial portion of her symptoms should be improved with the surgical decompression, but I noted to Claudia Dickerson also that she has other arthritides in her back that create some symptoms that may not be improved with this modest surgical intervention. That still being the case, I believe  that she would benefit from decompression at the L2-L3 level. We can schedule this at her convenience. __________________________ Victory Gens MD, MD  Job ID: [id number] DD: 08/01/2022 10:02:27 DT: 08/01/2022 12:10:51 REVIEWED BY: cdl Electronically signed by Victory DOROTHA Gens MD on 08/21/2022 01:24 PM

## 2023-11-06 ENCOUNTER — Other Ambulatory Visit: Payer: Self-pay | Admitting: Neurological Surgery

## 2023-11-11 ENCOUNTER — Other Ambulatory Visit: Payer: Self-pay | Admitting: Family Medicine

## 2023-11-11 DIAGNOSIS — M545 Low back pain, unspecified: Secondary | ICD-10-CM

## 2023-11-20 ENCOUNTER — Other Ambulatory Visit: Payer: Self-pay

## 2023-11-20 ENCOUNTER — Ambulatory Visit (HOSPITAL_COMMUNITY)
Admission: RE | Admit: 2023-11-20 | Discharge: 2023-11-20 | Disposition: A | Discharge status: Home / Self Care | Source: Ambulatory Visit | Attending: Neurological Surgery | Admitting: Neurological Surgery

## 2023-11-20 DIAGNOSIS — M4726 Other spondylosis with radiculopathy, lumbar region: Secondary | ICD-10-CM | POA: Diagnosis not present

## 2023-11-20 DIAGNOSIS — M5135 Other intervertebral disc degeneration, thoracolumbar region: Secondary | ICD-10-CM | POA: Diagnosis not present

## 2023-11-20 DIAGNOSIS — M5126 Other intervertebral disc displacement, lumbar region: Secondary | ICD-10-CM | POA: Diagnosis not present

## 2023-11-20 DIAGNOSIS — M4804 Spinal stenosis, thoracic region: Secondary | ICD-10-CM | POA: Insufficient documentation

## 2023-11-20 DIAGNOSIS — Z981 Arthrodesis status: Secondary | ICD-10-CM | POA: Insufficient documentation

## 2023-11-20 DIAGNOSIS — M48061 Spinal stenosis, lumbar region without neurogenic claudication: Secondary | ICD-10-CM | POA: Diagnosis not present

## 2023-11-20 DIAGNOSIS — M4316 Spondylolisthesis, lumbar region: Secondary | ICD-10-CM | POA: Diagnosis not present

## 2023-11-20 DIAGNOSIS — I700 Atherosclerosis of aorta: Secondary | ICD-10-CM | POA: Insufficient documentation

## 2023-11-20 DIAGNOSIS — M48062 Spinal stenosis, lumbar region with neurogenic claudication: Secondary | ICD-10-CM | POA: Diagnosis not present

## 2023-11-20 DIAGNOSIS — M5416 Radiculopathy, lumbar region: Secondary | ICD-10-CM | POA: Diagnosis not present

## 2023-11-20 MED ORDER — IOHEXOL 180 MG/ML  SOLN
20.0000 mL | Freq: Once | INTRAMUSCULAR | Status: AC | PRN
  Administered 2023-11-20: 10 mL via INTRATHECAL

## 2023-11-20 MED ORDER — DIAZEPAM 5 MG PO TABS
ORAL_TABLET | ORAL | Status: AC
  Filled 2023-11-20: qty 2

## 2023-11-20 MED ORDER — ONDANSETRON HCL 4 MG/2ML IJ SOLN: 4.0000 mg | Freq: Four times a day (QID) | INTRAMUSCULAR | Status: DC | PRN

## 2023-11-20 MED ORDER — HYDROCODONE-ACETAMINOPHEN 5-325 MG PO TABS: 1.0000 | ORAL_TABLET | ORAL | Status: DC | PRN

## 2023-11-20 MED ORDER — LIDOCAINE HCL (PF) 1 % IJ SOLN
5.0000 mL | Freq: Once | INTRAMUSCULAR | Status: AC
  Administered 2023-11-20: 2 mL via INTRADERMAL

## 2023-11-20 MED ORDER — DIAZEPAM 5 MG PO TABS
10.0000 mg | ORAL_TABLET | Freq: Once | ORAL | Status: AC
  Administered 2023-11-20: 10 mg via ORAL
  Filled 2023-11-20: qty 2

## 2023-11-20 NOTE — Discharge Instructions (Signed)
Myelogram and Lumbar Puncture Discharge Instructions  Go home and rest quietly for the next 24 hours.  It is important to lie flat for the next 24 hours.  Get up only to go to the restroom.  You may lie in the bed or on a couch on your back, your stomach, your left side or your right side.  You may have one pillow under your head.  You may have pillows between your knees while you are on your side or under your knees while you are on your back.  DO NOT drive today.  Recline the seat as far back as it will go, while still wearing your seat belt, on the way home.  You may get up to go to the bathroom as needed.  You may sit up for 10 minutes to eat.  You may resume your normal diet and medications unless otherwise indicated.  The incidence of headache, nausea, or vomiting is about 5% (one in 20 patients).  If you develop a headache, lie flat and drink plenty of fluids until the headache goes away.  Caffeinated beverages may be helpful.  If you develop severe nausea and vomiting or a headache that does not go away with flat bed rest, call Dr Elsner.  You may resume normal activities after your 24 hours of bed rest is over; however, do not exert yourself strongly or do any heavy lifting tomorrow.  Call your physician for a follow-up appointment.  The results of your myelogram will be sent directly to your physician by the following day.  If you have any questions or if complications develop after you arrive home, please call Dr Elsner.  Discharge instructions have been explained to the patient.  The patient, or the person responsible for the patient, fully understands these instructions.   

## 2023-11-20 NOTE — Progress Notes (Signed)
 Patient and patient daughter given discharge instructions, education provided no further questions at this time. Patient able to ambulate and void before discharge. Able to tolerate PO intake. Patient site is clean, dry, intact and soft upon discharge. MD Elsner called in order to verify home medications, MD states he will be unable to do so for awhile and request that I please, tell patient to resume all medications as before does not want any medication changes at this time. Verified MD order with read back about what was stated MD states this is correct, education provided to patient and family member.

## 2023-11-20 NOTE — Procedures (Signed)
 Patient is an 83 year old individual whose had a previous decompression and fusion from L3-L5.  She has been having significant low back pain with pain into gluteal regions she has some advancing degenerative changes elsewhere in the lumbar spine and I advised a myelogram and postmyelogram CAT scan as she has significant hardware that would aggravate more imaging in the adjacent area.  Pre op Dx: Lumbar spondylosis and stenosis with radiculopathy, neurogenic claudication.  History of fusion L3-L5. Post op Dx: Same Procedure: Lumbar myelogram Surgeon: Mairely Foxworth Puncture level: L2-3 Fluid color: Clear colorless Injection: Iohexol 180, 10 cc Findings: Moderate spondylitic disease above L3 no overt nerve root cutoff advanced degenerative changes L5-S1 without nerve root cutoff further evaluation with CT scanning.

## 2023-11-27 DIAGNOSIS — M4316 Spondylolisthesis, lumbar region: Secondary | ICD-10-CM | POA: Diagnosis not present

## 2023-11-27 DIAGNOSIS — Z6827 Body mass index (BMI) 27.0-27.9, adult: Secondary | ICD-10-CM | POA: Diagnosis not present

## 2023-12-26 DIAGNOSIS — J3489 Other specified disorders of nose and nasal sinuses: Secondary | ICD-10-CM | POA: Diagnosis not present

## 2023-12-26 DIAGNOSIS — R070 Pain in throat: Secondary | ICD-10-CM | POA: Diagnosis not present

## 2023-12-26 DIAGNOSIS — J069 Acute upper respiratory infection, unspecified: Secondary | ICD-10-CM | POA: Diagnosis not present

## 2023-12-26 DIAGNOSIS — R051 Acute cough: Secondary | ICD-10-CM | POA: Diagnosis not present

## 2024-01-01 DIAGNOSIS — J019 Acute sinusitis, unspecified: Secondary | ICD-10-CM | POA: Diagnosis not present

## 2024-01-01 DIAGNOSIS — R0981 Nasal congestion: Secondary | ICD-10-CM | POA: Diagnosis not present

## 2024-01-01 DIAGNOSIS — R350 Frequency of micturition: Secondary | ICD-10-CM | POA: Diagnosis not present

## 2024-01-01 DIAGNOSIS — B9689 Other specified bacterial agents as the cause of diseases classified elsewhere: Secondary | ICD-10-CM | POA: Diagnosis not present

## 2024-01-06 DIAGNOSIS — H04123 Dry eye syndrome of bilateral lacrimal glands: Secondary | ICD-10-CM | POA: Diagnosis not present

## 2024-03-18 ENCOUNTER — Ambulatory Visit: Admitting: Family Medicine

## 2024-05-28 ENCOUNTER — Ambulatory Visit
